# Patient Record
Sex: Female | Born: 1990 | Race: White | Hispanic: No | Marital: Married | Smoking: Never smoker
Health system: Southern US, Community
[De-identification: ages and names within clinical notes are randomized; demographics above are authoritative.]

## PROBLEM LIST (undated history)

## (undated) DIAGNOSIS — F32A Depression, unspecified: Secondary | ICD-10-CM

## (undated) DIAGNOSIS — Z8619 Personal history of other infectious and parasitic diseases: Secondary | ICD-10-CM

## (undated) DIAGNOSIS — F419 Anxiety disorder, unspecified: Secondary | ICD-10-CM

## (undated) DIAGNOSIS — K219 Gastro-esophageal reflux disease without esophagitis: Secondary | ICD-10-CM

## (undated) DIAGNOSIS — J45909 Unspecified asthma, uncomplicated: Secondary | ICD-10-CM

## (undated) DIAGNOSIS — R51 Headache: Secondary | ICD-10-CM

## (undated) DIAGNOSIS — F329 Major depressive disorder, single episode, unspecified: Secondary | ICD-10-CM

## (undated) DIAGNOSIS — T7840XA Allergy, unspecified, initial encounter: Secondary | ICD-10-CM

## (undated) DIAGNOSIS — R519 Headache, unspecified: Secondary | ICD-10-CM

## (undated) HISTORY — PX: WISDOM TOOTH EXTRACTION: SHX21

## (undated) HISTORY — DX: Unspecified asthma, uncomplicated: J45.909

## (undated) HISTORY — DX: Allergy, unspecified, initial encounter: T78.40XA

## (undated) HISTORY — DX: Gastro-esophageal reflux disease without esophagitis: K21.9

## (undated) HISTORY — PX: WRIST SURGERY: SHX841

## (undated) HISTORY — DX: Major depressive disorder, single episode, unspecified: F32.9

## (undated) HISTORY — DX: Headache, unspecified: R51.9

## (undated) HISTORY — DX: Personal history of other infectious and parasitic diseases: Z86.19

## (undated) HISTORY — DX: Depression, unspecified: F32.A

## (undated) HISTORY — DX: Headache: R51

---
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
## 2013-11-18 LAB — HM PAP SMEAR: HM Pap smear: NORMAL

## 2014-04-11 ENCOUNTER — Ambulatory Visit: Payer: Self-pay | Admitting: Physician Assistant

## 2014-07-18 ENCOUNTER — Telehealth: Payer: Self-pay

## 2014-07-18 NOTE — Telephone Encounter (Signed)
New patient  Left message for call back Non-identifiable

## 2014-07-19 ENCOUNTER — Ambulatory Visit (INDEPENDENT_AMBULATORY_CARE_PROVIDER_SITE_OTHER): Payer: Commercial Managed Care - PPO | Admitting: Family Medicine

## 2014-07-19 ENCOUNTER — Encounter: Payer: Self-pay | Admitting: Family Medicine

## 2014-07-19 VITALS — BP 110/84 | HR 93 | Temp 98.0°F | Resp 16 | Ht 65.75 in | Wt 181.2 lb

## 2014-07-19 DIAGNOSIS — F988 Other specified behavioral and emotional disorders with onset usually occurring in childhood and adolescence: Secondary | ICD-10-CM

## 2014-07-19 DIAGNOSIS — Z7289 Other problems related to lifestyle: Secondary | ICD-10-CM | POA: Insufficient documentation

## 2014-07-19 DIAGNOSIS — F419 Anxiety disorder, unspecified: Principal | ICD-10-CM | POA: Insufficient documentation

## 2014-07-19 DIAGNOSIS — F32A Depression, unspecified: Secondary | ICD-10-CM | POA: Insufficient documentation

## 2014-07-19 DIAGNOSIS — F329 Major depressive disorder, single episode, unspecified: Secondary | ICD-10-CM

## 2014-07-19 DIAGNOSIS — F341 Dysthymic disorder: Secondary | ICD-10-CM

## 2014-07-19 DIAGNOSIS — F489 Nonpsychotic mental disorder, unspecified: Secondary | ICD-10-CM

## 2014-07-19 HISTORY — DX: Other problems related to lifestyle: Z72.89

## 2014-07-19 MED ORDER — TRAZODONE HCL 50 MG PO TABS: 25.0000 mg | ORAL_TABLET | Freq: Every evening | ORAL | Status: DC | PRN

## 2014-07-19 MED ORDER — FLUOXETINE HCL 20 MG PO TABS: 20.0000 mg | ORAL_TABLET | Freq: Every day | ORAL | Status: DC

## 2014-07-19 NOTE — Progress Notes (Signed)
Pre visit review using our clinic review tool, if applicable. No additional management support is needed unless otherwise documented below in the visit note. 
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
## 2014-07-19 NOTE — Patient Instructions (Signed)
Follow up in 3-4 weeks to recheck mood Start the Prozac daily for anxiety/depression Use the Trazodone nightly as needed for sleep Start counseling as planned Call with any questions or concerns Welcome!  We're glad to have you! Hang in there!

## 2014-07-21 NOTE — Assessment & Plan Note (Signed)
New to provider, ongoing for pt.  Previously on Strattera- did not do well on this.  Discussed need to tx anxiety/depression prior to treating ADD.  Will follow.

## 2014-07-21 NOTE — Assessment & Plan Note (Signed)
New to provider, ongoing for pt.  She has committed to not cutting any more.  Has appt upcoming w/ therapist.  Pt able to contract for safety.  Will follow closely.

## 2014-07-21 NOTE — Assessment & Plan Note (Signed)
New to provider, ongoing for pt.  sxs are moderate to severe.  Has never been on medication.  Has appt upcoming w/ therapist.  Will start low dose SSRI and monitor for improvement.  Discussed that she may require psychiatrist if unable to control sxs.  Pt expressed understanding and is in agreement w/ plan.

## 2014-07-23 NOTE — Telephone Encounter (Signed)
Unable to reach patient pre visit.  

## 2014-08-15 ENCOUNTER — Ambulatory Visit (INDEPENDENT_AMBULATORY_CARE_PROVIDER_SITE_OTHER): Payer: Commercial Managed Care - PPO | Admitting: Family Medicine

## 2014-08-15 ENCOUNTER — Encounter: Payer: Self-pay | Admitting: Family Medicine

## 2014-08-15 VITALS — BP 110/76 | HR 77 | Temp 98.3°F | Resp 16 | Wt 183.5 lb

## 2014-08-15 DIAGNOSIS — F341 Dysthymic disorder: Secondary | ICD-10-CM

## 2014-08-15 DIAGNOSIS — F329 Major depressive disorder, single episode, unspecified: Secondary | ICD-10-CM

## 2014-08-15 DIAGNOSIS — F32A Depression, unspecified: Secondary | ICD-10-CM

## 2014-08-15 DIAGNOSIS — F419 Anxiety disorder, unspecified: Principal | ICD-10-CM

## 2014-08-15 NOTE — Progress Notes (Signed)
Pre visit review using our clinic review tool, if applicable. No additional management support is needed unless otherwise documented below in the visit note. 

## 2014-08-15 NOTE — Progress Notes (Signed)
   Subjective:    Patient ID: [name], female    DOB: 05-21-1991, 23 y.o.   MRN: [id number]  HPI Depression- pt reports feeling 'a lot' better.  'every one has noticed a big difference'.  Pt reports sleeping better.  Pt reports being less anxious.  Still having some problems w/ attention.  Not currently cutting.  Has appt next week to start therapy w/ Drusilla Kanner.  Rare palpitations.  No SOB.   Review of Systems For ROS see HPI     Objective:   Physical Exam  Vitals reviewed. Constitutional: She is oriented to person, place, and time. She appears well-developed and well-nourished. No distress.  HENT:  Head: Normocephalic and atraumatic.  Cardiovascular: Normal rate, regular rhythm and normal heart sounds.   Pulmonary/Chest: Effort normal and breath sounds normal. No respiratory distress. She has no wheezes. She has no rales.  Neurological: She is alert and oriented to person, place, and time.  Skin: Skin is warm and dry.  Psychiatric: She has a normal mood and affect. Her behavior is normal. Thought content normal.          Assessment & Plan:

## 2014-08-15 NOTE — Assessment & Plan Note (Signed)
Much improved since starting Prozac.  Pt is not interested in increasing dose at this time as she is doing so much better.  Pt continues to have some issues w/ her focus but will hold on stimulants until pt's anxiety is under good control and she has started her therapy sessions.  Will follow closely.

## 2014-08-15 NOTE — Patient Instructions (Signed)
Follow up in 6-8 weeks to recheck anxiety and attention Continue the meds daily as directed and use the sleep meds as needed Keep up the good work!  You're doing great! Call with any questions or concerns I'm SO proud of you!

## 2014-10-02 ENCOUNTER — Ambulatory Visit: Payer: Commercial Managed Care - PPO | Admitting: Family Medicine

## 2014-11-18 ENCOUNTER — Encounter: Payer: Self-pay | Admitting: Family Medicine

## 2014-11-18 ENCOUNTER — Encounter: Payer: Self-pay | Admitting: General Practice

## 2014-11-18 ENCOUNTER — Ambulatory Visit (INDEPENDENT_AMBULATORY_CARE_PROVIDER_SITE_OTHER): Payer: Commercial Managed Care - PPO | Admitting: Family Medicine

## 2014-11-18 ENCOUNTER — Ambulatory Visit (INDEPENDENT_AMBULATORY_CARE_PROVIDER_SITE_OTHER): Payer: Commercial Managed Care - PPO | Admitting: General Practice

## 2014-11-18 VITALS — BP 112/72 | HR 110 | Temp 98.1°F | Resp 16 | Wt 191.4 lb

## 2014-11-18 DIAGNOSIS — F988 Other specified behavioral and emotional disorders with onset usually occurring in childhood and adolescence: Secondary | ICD-10-CM

## 2014-11-18 DIAGNOSIS — F418 Other specified anxiety disorders: Secondary | ICD-10-CM

## 2014-11-18 DIAGNOSIS — Z23 Encounter for immunization: Secondary | ICD-10-CM

## 2014-11-18 DIAGNOSIS — F419 Anxiety disorder, unspecified: Principal | ICD-10-CM

## 2014-11-18 DIAGNOSIS — F909 Attention-deficit hyperactivity disorder, unspecified type: Secondary | ICD-10-CM

## 2014-11-18 DIAGNOSIS — F329 Major depressive disorder, single episode, unspecified: Secondary | ICD-10-CM

## 2014-11-18 MED ORDER — AMPHETAMINE-DEXTROAMPHET ER 20 MG PO CP24: 20.0000 mg | ORAL_CAPSULE | Freq: Every day | ORAL | Status: DC

## 2014-11-18 MED ORDER — FLUOXETINE HCL 20 MG PO TABS: 20.0000 mg | ORAL_TABLET | Freq: Every day | ORAL | Status: DC

## 2014-11-18 NOTE — Progress Notes (Signed)
   Subjective:    Patient ID: Claire Rivas, female    DOB: 09/21/1991, 23 y.o.   MRN: [id number]  HPI Depression- chronic problem, pt reports doing 'really good' on Prozac.  Ran out yesterday.  'my mood has been really great'.  Pt reports sleeping better.  No thoughts of self harm or recent cutting.  ADD- chronic problem, pt is not currently medicated and reports having a very difficult time concentrating or completing tasks.  Pt's difficulties are occuring at both home and work.  Pt was on Strattera previously and it was 'too high a dose' and it made pt very sick.  Pt is not interested in restarting Strattera.  Pt has been on Adderall previously and 'this worked really well' but 'the dose was too high'.   Review of Systems For ROS see HPI     Objective:   Physical Exam  Constitutional: She is oriented to person, place, and time. She appears well-developed and well-nourished. No distress.  HENT:  Head: Normocephalic and atraumatic.  Cardiovascular: Normal rate, regular rhythm, normal heart sounds and intact distal pulses.   Pulmonary/Chest: Effort normal and breath sounds normal. No respiratory distress. She has no wheezes. She has no rales.  Neurological: She is alert and oriented to person, place, and time.  Skin: Skin is warm and dry.  Psychiatric: She has a normal mood and affect. Her behavior is normal. Thought content normal.  Vitals reviewed.         Assessment & Plan:

## 2014-11-18 NOTE — Progress Notes (Signed)
Pre visit review using our clinic review tool, if applicable. No additional management support is needed unless otherwise documented below in the visit note. 

## 2014-11-18 NOTE — Patient Instructions (Signed)
Follow up in 6 weeks to recheck attention Start the Adderall in the AM Continue the Fluoxetine daily for depression/anxiety Please monitor your anxiety level now that we're starting a stimulant medication Call with any questions or concerns Happy Holidays!

## 2014-11-19 NOTE — Assessment & Plan Note (Signed)
Pt continues to struggle w/ attention and concentration, despite anxiety and depression improving.  Pt not willing to start Strattera due to previous bad reaction.  Has been on Adderall previously w/o difficulty but felt the dose was too high.  Will start at adult starting dose and monitor for symptom improvement.  Controlled substance agreement signed.

## 2014-11-19 NOTE — Assessment & Plan Note (Signed)
Improved since starting Prozac.  Will continue to follow and adjust dose prn.

## 2014-12-23 ENCOUNTER — Telehealth: Payer: Self-pay | Admitting: Family Medicine

## 2014-12-23 MED ORDER — AMPHETAMINE-DEXTROAMPHET ER 20 MG PO CP24: 20.0000 mg | ORAL_CAPSULE | Freq: Every day | ORAL | Status: DC

## 2014-12-23 NOTE — Telephone Encounter (Signed)
Caller name: Theodosia Quayoole, Tamico L Relation to pt: self  Call back number: [phone]   Reason for call:  Pt requesting a refill amphetamine-dextroamphetamine (ADDERALL XR) 20 MG 24 hr capsule

## 2014-12-23 NOTE — Telephone Encounter (Signed)
Med filled will notify pt.  

## 2014-12-23 NOTE — Telephone Encounter (Signed)
Ok for #30 

## 2014-12-23 NOTE — Telephone Encounter (Signed)
Last OV 11-18-14  adderall filled 11-18-14 #30 with 0

## 2014-12-30 ENCOUNTER — Ambulatory Visit: Payer: Commercial Managed Care - PPO | Admitting: Family Medicine

## 2015-01-02 ENCOUNTER — Ambulatory Visit (INDEPENDENT_AMBULATORY_CARE_PROVIDER_SITE_OTHER): Payer: Commercial Managed Care - PPO | Admitting: Family Medicine

## 2015-01-02 ENCOUNTER — Encounter: Payer: Self-pay | Admitting: Family Medicine

## 2015-01-02 VITALS — BP 112/70 | HR 78 | Temp 98.1°F | Resp 16 | Wt 198.0 lb

## 2015-01-02 DIAGNOSIS — F329 Major depressive disorder, single episode, unspecified: Secondary | ICD-10-CM

## 2015-01-02 DIAGNOSIS — F988 Other specified behavioral and emotional disorders with onset usually occurring in childhood and adolescence: Secondary | ICD-10-CM

## 2015-01-02 DIAGNOSIS — F419 Anxiety disorder, unspecified: Secondary | ICD-10-CM

## 2015-01-02 DIAGNOSIS — F418 Other specified anxiety disorders: Secondary | ICD-10-CM

## 2015-01-02 DIAGNOSIS — F909 Attention-deficit hyperactivity disorder, unspecified type: Secondary | ICD-10-CM

## 2015-01-02 DIAGNOSIS — F32A Depression, unspecified: Secondary | ICD-10-CM

## 2015-01-02 NOTE — Assessment & Plan Note (Signed)
Chronic problem.  Pt still feels her Prozac dose is appropriate and that she is able to handle her grief over the loss of her cat.  No med changes at this time.  Will follow.

## 2015-01-02 NOTE — Assessment & Plan Note (Signed)
Chronic problem.  sxs are much better since starting meds last visit.  No noted side effects.  Not hypertensive or tachycardic due to stimulant.  No med changes at this time.  Will follow.

## 2015-01-02 NOTE — Progress Notes (Signed)
   Subjective:    Patient ID: Claire Rivas, female    DOB: 06/02/1991, 24 y.o.   MRN: [id number]  HPI Depression- chronic problem.  Pt recently lost her kitten due to seizures.  This has been very hard for pt.  She started a [employer]Fund[employer] Account to support the other surviving cat.  Pt finds this very cathartic b/c 'people have been supportive'.  Pt is crying herself to sleep regularly.  Pt is not interested in adjusting meds, 'i just have to get through it'.  ADHD- started Adderall at last visit.  BP is WNL, HR WNL.  Pt feels focus has improved 'a lot'.  No palpitations.   Review of Systems For ROS see HPI     Objective:   Physical Exam  Constitutional: She is oriented to person, place, and time. She appears well-developed and well-nourished. No distress.  HENT:  Head: Normocephalic and atraumatic.  Eyes: Conjunctivae and EOM are normal. Pupils are equal, round, and reactive to light.  Neck: Normal range of motion. Neck supple. No thyromegaly present.  Cardiovascular: Normal rate, regular rhythm, normal heart sounds and intact distal pulses.   No murmur heard. Pulmonary/Chest: Effort normal and breath sounds normal. No respiratory distress.  Musculoskeletal: She exhibits no edema.  Lymphadenopathy:    She has no cervical adenopathy.  Neurological: She is alert and oriented to person, place, and time.  Skin: Skin is warm and dry.  Psychiatric: She has a normal mood and affect. Her behavior is normal.  Vitals reviewed.         Assessment & Plan:

## 2015-01-02 NOTE — Progress Notes (Signed)
Pre visit review using our clinic review tool, if applicable. No additional management support is needed unless otherwise documented below in the visit note. 

## 2015-01-02 NOTE — Patient Instructions (Signed)
Schedule your complete physical in 6 months No med changes at this time Call with any questions or concerns Keep up the good work!  You're doing great! Stay safe and warm this weekend!!!

## 2015-01-23 ENCOUNTER — Other Ambulatory Visit: Payer: Self-pay | Admitting: General Practice

## 2015-01-23 ENCOUNTER — Telehealth: Payer: Self-pay | Admitting: Family Medicine

## 2015-01-23 MED ORDER — AMPHETAMINE-DEXTROAMPHET ER 20 MG PO CP24: 20.0000 mg | ORAL_CAPSULE | Freq: Every day | ORAL | Status: DC

## 2015-01-23 MED ORDER — FLUOXETINE HCL 20 MG PO TABS: 20.0000 mg | ORAL_TABLET | Freq: Every day | ORAL | Status: DC

## 2015-01-23 NOTE — Telephone Encounter (Signed)
Caller name:Preeti Dutch QuintPoole Relationship to patient:self Can be reached:cbr cell   Reason for call: [facility] and needs to have her adderall and prozac refilled before moving- was last seen 01/02/15.

## 2015-01-23 NOTE — Telephone Encounter (Signed)
meds filled and pt notified.  

## 2015-06-16 ENCOUNTER — Emergency Department (HOSPITAL_COMMUNITY)
Admission: EM | Admit: 2015-06-16 | Discharge: 2015-06-17 | Disposition: A | Discharge status: Other Acute Inpatient Hospital | Payer: Commercial Managed Care - PPO | Attending: Emergency Medicine | Admitting: Emergency Medicine

## 2015-06-16 ENCOUNTER — Encounter (HOSPITAL_COMMUNITY): Payer: Self-pay | Admitting: Nurse Practitioner

## 2015-06-16 DIAGNOSIS — Z8619 Personal history of other infectious and parasitic diseases: Secondary | ICD-10-CM | POA: Insufficient documentation

## 2015-06-16 DIAGNOSIS — Z3202 Encounter for pregnancy test, result negative: Secondary | ICD-10-CM | POA: Insufficient documentation

## 2015-06-16 DIAGNOSIS — R45851 Suicidal ideations: Secondary | ICD-10-CM | POA: Diagnosis present

## 2015-06-16 DIAGNOSIS — F1510 Other stimulant abuse, uncomplicated: Secondary | ICD-10-CM | POA: Diagnosis not present

## 2015-06-16 DIAGNOSIS — J45909 Unspecified asthma, uncomplicated: Secondary | ICD-10-CM | POA: Diagnosis not present

## 2015-06-16 DIAGNOSIS — Z79899 Other long term (current) drug therapy: Secondary | ICD-10-CM | POA: Insufficient documentation

## 2015-06-16 DIAGNOSIS — F322 Major depressive disorder, single episode, severe without psychotic features: Secondary | ICD-10-CM | POA: Diagnosis present

## 2015-06-16 DIAGNOSIS — K219 Gastro-esophageal reflux disease without esophagitis: Secondary | ICD-10-CM | POA: Insufficient documentation

## 2015-06-16 DIAGNOSIS — F1210 Cannabis abuse, uncomplicated: Secondary | ICD-10-CM | POA: Insufficient documentation

## 2015-06-16 LAB — CBC
HCT: 38.8 % (ref 36.0–46.0)
HEMOGLOBIN: 13 g/dL (ref 12.0–15.0)
MCH: 28.6 pg (ref 26.0–34.0)
MCHC: 33.5 g/dL (ref 30.0–36.0)
MCV: 85.5 fL (ref 78.0–100.0)
PLATELETS: 262 10*3/uL (ref 150–400)
RBC: 4.54 MIL/uL (ref 3.87–5.11)
RDW: 12.4 % (ref 11.5–15.5)
WBC: 7.3 10*3/uL (ref 4.0–10.5)

## 2015-06-16 LAB — POC URINE PREG, ED: PREG TEST UR: NEGATIVE

## 2015-06-16 MED ORDER — ACETAMINOPHEN 325 MG PO TABS: 650.0000 mg | ORAL_TABLET | ORAL | Status: DC | PRN

## 2015-06-16 MED ORDER — LORAZEPAM 1 MG PO TABS: 1.0000 mg | ORAL_TABLET | Freq: Three times a day (TID) | ORAL | Status: DC | PRN

## 2015-06-16 MED ORDER — ONDANSETRON HCL 4 MG PO TABS
4.0000 mg | ORAL_TABLET | Freq: Three times a day (TID) | ORAL | Status: DC | PRN
Start: 2015-06-16 — End: 2015-06-17

## 2015-06-16 NOTE — ED Provider Notes (Signed)
CSN: [id number]     Arrival date & time 06/16/15  2301 History   This chart was scribed for non-physician practitioner, Elpidio Anis, PA-C, working with Layla Maw Ward, DO, by Budd Palmer ED Scribe. This patient was seen in room [facility]4/[facility] and the patient's care was started at 11:24 PM    Chief Complaint  Patient presents with  . Suicidal  . Depression  The history is provided by the patient. No language interpreter was used.   HPI Comments: Claire Rivas is a 24 y.o. female with a PMHx of depression who presents to the Emergency Department complaining of SI and for the last several days. She has been with friends who have worked with her to make her feel better but she feels her symptoms are getting worse. No self harm, no HI. She has a Hx of cutting, but not of suicidal attempt.  Past Medical History  Diagnosis Date  . Asthma   . History of chicken pox   . Depression   . Frequent headaches   . GERD (gastroesophageal reflux disease)   . Allergy    Past Surgical History  Procedure Laterality Date  . Wisdom tooth extraction     Family History  Problem Relation Age of Onset  . Mental illness Mother   . Depression Mother   . Diabetes Father     type 2  . Arthritis Maternal Grandmother   . Hyperlipidemia Maternal Grandmother   . Heart disease Maternal Grandmother   . Alcohol abuse Maternal Grandfather   . Hypertension Maternal Grandfather   . Diabetes Paternal Grandmother   . Cancer Other     lung  . Heart disease Other    History  Substance Use Topics  . Smoking status: Never Smoker   . Smokeless tobacco: Not on file  . Alcohol Use: Yes   OB History    No data available     Review of Systems  Constitutional: Negative for fever and chills.  HENT: Negative.   Respiratory: Negative.   Cardiovascular: Negative.   Gastrointestinal: Negative.   Musculoskeletal: Negative.   Skin: Negative.   Neurological: Negative.   Psychiatric/Behavioral: Positive for suicidal  ideas and dysphoric mood.  All other systems reviewed and are negative.   Allergies  Sulfa antibiotics  Home Medications   Prior to Admission medications   Medication Sig Start Date End Date Taking? Authorizing Provider  amphetamine-dextroamphetamine (ADDERALL XR) 20 MG 24 hr capsule Take 1 capsule (20 mg total) by mouth daily. 01/23/15   Sheliah Hatch, MD  FLUoxetine (PROZAC) 20 MG tablet Take 1 tablet (20 mg total) by mouth daily. 01/23/15   Sheliah Hatch, MD  traZODone (DESYREL) 50 MG tablet Take 0.5-1 tablets (25-50 mg total) by mouth at bedtime as needed for sleep. Patient not taking: Reported on 01/02/2015 07/19/14   Sheliah Hatch, MD   BP 126/83 mmHg  Pulse 100  Temp(Src) 98.2 F (36.8 C) (Oral)  Resp 14  Ht  (1.651 m)  Wt 185 lb (83.915 kg)  BMI 30.79 kg/m2  SpO2 98% Physical Exam  Constitutional: She is oriented to person, place, and time. She appears well-developed and well-nourished. No distress.  HENT:  Head: Normocephalic and atraumatic.  Mouth/Throat: Oropharynx is clear and moist.  Eyes: Conjunctivae and EOM are normal. Pupils are equal, round, and reactive to light.  Neck: Normal range of motion. Neck supple. No tracheal deviation present.  Cardiovascular: Normal rate.   Pulmonary/Chest: Breath sounds normal. No  respiratory distress.  Abdominal: Soft.  Musculoskeletal: Normal range of motion.  Neurological: She is alert and oriented to person, place, and time.  Skin: Skin is warm and dry.  Nursing note and vitals reviewed.   ED Course  Procedures  DIAGNOSTIC STUDIES: Oxygen Saturation is 98% on RA, normal by my interpretation.    COORDINATION OF CARE: 11:27 PM - Discussed plans to medically clear her and give her a health assessment. Pt advised of plan for treatment and pt agrees.  Labs Review Labs Reviewed  CBC  COMPREHENSIVE METABOLIC PANEL  ETHANOL  ACETAMINOPHEN LEVEL  SALICYLATE LEVEL  URINE RAPID DRUG SCREEN, HOSP PERFORMED   POC URINE PREG, ED   Results for orders placed or performed during the hospital encounter of 06/16/15  CBC  Result Value Ref Range   WBC 7.3 4.0 - 10.5 K/uL   RBC 4.54 3.87 - 5.11 MIL/uL   Hemoglobin 13.0 12.0 - 15.0 g/dL   HCT 16.138.8 09.636.0 - 04.546.0 %   MCV 85.5 78.0 - 100.0 fL   MCH 28.6 26.0 - 34.0 pg   MCHC 33.5 30.0 - 36.0 g/dL   RDW 40.912.4 81.111.5 - 91.415.5 %   Platelets 262 150 - 400 K/uL  Comprehensive metabolic panel  Result Value Ref Range   Sodium 137 135 - 145 mmol/L   Potassium 3.4 (L) 3.5 - 5.1 mmol/L   Chloride 107 101 - 111 mmol/L   CO2 23 22 - 32 mmol/L   Glucose, Bld 109 (H) 65 - 99 mg/dL   BUN 14 6 - 20 mg/dL   Creatinine, Ser 7.820.64 0.44 - 1.00 mg/dL   Calcium 8.9 8.9 - 95.610.3 mg/dL   Total Protein 7.1 6.5 - 8.1 g/dL   Albumin 4.3 3.5 - 5.0 g/dL   AST 23 15 - 41 U/L   ALT 28 14 - 54 U/L   Alkaline Phosphatase 98 38 - 126 U/L   Total Bilirubin 0.8 0.3 - 1.2 mg/dL   GFR calc non Af Amer >60 >60 mL/min   GFR calc Af Amer >60 >60 mL/min   Anion gap 7 5 - 15  Ethanol (ETOH)  Result Value Ref Range   Alcohol, Ethyl (B) <5 <5 mg/dL  Acetaminophen level  Result Value Ref Range   Acetaminophen (Tylenol), Serum <10 (L) 10 - 30 ug/mL  Salicylate level  Result Value Ref Range   Salicylate Lvl <4.0 2.8 - 30.0 mg/dL  Urine rapid drug screen (hosp performed)not at [facility]  Result Value Ref Range   Opiates NONE DETECTED NONE DETECTED   Cocaine NONE DETECTED NONE DETECTED   Benzodiazepines NONE DETECTED NONE DETECTED   Amphetamines POSITIVE (A) NONE DETECTED   Tetrahydrocannabinol POSITIVE (A) NONE DETECTED   Barbiturates NONE DETECTED NONE DETECTED  POC Urine Pregnancy, ED ([facility] NOT [facility])  Result Value Ref Range   Preg Test, Ur NEGATIVE NEGATIVE    Imaging Review No results found.   EKG Interpretation None      MDM   Final diagnoses:  None    1. Suicidal ideation  Will place in psych hold waiting for TTS consultation to determine disposition.   I  personally performed the services described in this documentation, which was scribed in my presence. The recorded information has been reviewed and is accurate.     Elpidio AnisShari Yousof Alderman, PA-C 06/17/15 0325  Layla MawKristen N Ward, DO 06/17/15 [phone]

## 2015-06-16 NOTE — ED Notes (Signed)
Pt states she feels suicidal, states she plans to use razors four days ago, also states she has been severely depressed. All these she states is secondary to family alienation which she says is the reason she has come in to be seen today.

## 2015-06-16 NOTE — ED Notes (Signed)
Called staffing requesting a sitter.

## 2015-06-16 NOTE — ED Notes (Addendum)
Pt has been seen and wanded by security.   Pt has been changed into scrubs. Pt has white belonging bag and flower covered duffle bag.

## 2015-06-17 ENCOUNTER — Inpatient Hospital Stay (HOSPITAL_COMMUNITY)
Admission: AD | Admit: 2015-06-17 | Discharge: 2015-06-20 | DRG: 885 | Disposition: A | Discharge status: Home / Self Care | Payer: 59 | Source: Intra-hospital | Attending: Psychiatry | Admitting: Psychiatry

## 2015-06-17 ENCOUNTER — Encounter (HOSPITAL_COMMUNITY): Payer: Self-pay | Admitting: *Deleted

## 2015-06-17 DIAGNOSIS — Z833 Family history of diabetes mellitus: Secondary | ICD-10-CM | POA: Diagnosis not present

## 2015-06-17 DIAGNOSIS — F322 Major depressive disorder, single episode, severe without psychotic features: Secondary | ICD-10-CM | POA: Diagnosis present

## 2015-06-17 DIAGNOSIS — J45909 Unspecified asthma, uncomplicated: Secondary | ICD-10-CM | POA: Diagnosis present

## 2015-06-17 DIAGNOSIS — Z8249 Family history of ischemic heart disease and other diseases of the circulatory system: Secondary | ICD-10-CM | POA: Diagnosis not present

## 2015-06-17 DIAGNOSIS — Z818 Family history of other mental and behavioral disorders: Secondary | ICD-10-CM

## 2015-06-17 DIAGNOSIS — R45851 Suicidal ideations: Secondary | ICD-10-CM | POA: Diagnosis present

## 2015-06-17 DIAGNOSIS — K219 Gastro-esophageal reflux disease without esophagitis: Secondary | ICD-10-CM | POA: Diagnosis present

## 2015-06-17 DIAGNOSIS — F329 Major depressive disorder, single episode, unspecified: Secondary | ICD-10-CM | POA: Diagnosis not present

## 2015-06-17 DIAGNOSIS — Z882 Allergy status to sulfonamides status: Secondary | ICD-10-CM

## 2015-06-17 DIAGNOSIS — F419 Anxiety disorder, unspecified: Secondary | ICD-10-CM | POA: Diagnosis not present

## 2015-06-17 HISTORY — DX: Anxiety disorder, unspecified: F41.9

## 2015-06-17 LAB — COMPREHENSIVE METABOLIC PANEL
ALBUMIN: 4.3 g/dL (ref 3.5–5.0)
ALT: 28 U/L (ref 14–54)
AST: 23 U/L (ref 15–41)
Alkaline Phosphatase: 98 U/L (ref 38–126)
Anion gap: 7 (ref 5–15)
BILIRUBIN TOTAL: 0.8 mg/dL (ref 0.3–1.2)
BUN: 14 mg/dL (ref 6–20)
CO2: 23 mmol/L (ref 22–32)
Calcium: 8.9 mg/dL (ref 8.9–10.3)
Chloride: 107 mmol/L (ref 101–111)
Creatinine, Ser: 0.64 mg/dL (ref 0.44–1.00)
Glucose, Bld: 109 mg/dL — ABNORMAL HIGH (ref 65–99)
POTASSIUM: 3.4 mmol/L — AB (ref 3.5–5.1)
Sodium: 137 mmol/L (ref 135–145)
TOTAL PROTEIN: 7.1 g/dL (ref 6.5–8.1)

## 2015-06-17 LAB — RAPID URINE DRUG SCREEN, HOSP PERFORMED
Amphetamines: POSITIVE — AB
BENZODIAZEPINES: NOT DETECTED
Barbiturates: NOT DETECTED
Cocaine: NOT DETECTED
OPIATES: NOT DETECTED
TETRAHYDROCANNABINOL: POSITIVE — AB

## 2015-06-17 LAB — SALICYLATE LEVEL: Salicylate Lvl: <4 mg/dL (ref 2.8–30.0)

## 2015-06-17 LAB — ETHANOL: Alcohol, Ethyl (B): <5 mg/dL (ref <5)

## 2015-06-17 LAB — ACETAMINOPHEN LEVEL: Acetaminophen (Tylenol), Serum: <10 ug/mL — ABNORMAL LOW (ref 10–30)

## 2015-06-17 MED ORDER — ALBUTEROL SULFATE HFA 108 (90 BASE) MCG/ACT IN AERS: 1.0000 | INHALATION_SPRAY | Freq: Four times a day (QID) | RESPIRATORY_TRACT | Status: DC | PRN

## 2015-06-17 MED ORDER — MAGNESIUM HYDROXIDE 400 MG/5ML PO SUSP: 30.0000 mL | Freq: Every day | ORAL | Status: DC | PRN

## 2015-06-17 MED ORDER — ACETAMINOPHEN 325 MG PO TABS
650.0000 mg | ORAL_TABLET | ORAL | Status: DC | PRN
  Administered 2015-06-17: 650 mg via ORAL
  Filled 2015-06-17: qty 2

## 2015-06-17 MED ORDER — ACETAMINOPHEN 325 MG PO TABS: 650.0000 mg | ORAL_TABLET | Freq: Four times a day (QID) | ORAL | Status: DC | PRN

## 2015-06-17 MED ORDER — FLUOXETINE HCL 20 MG PO CAPS
20.0000 mg | ORAL_CAPSULE | Freq: Every day | ORAL | Status: DC
  Administered 2015-06-17: 20 mg via ORAL
  Filled 2015-06-17: qty 1

## 2015-06-17 MED ORDER — ONDANSETRON HCL 4 MG PO TABS: 4.0000 mg | ORAL_TABLET | Freq: Three times a day (TID) | ORAL | Status: DC | PRN

## 2015-06-17 MED ORDER — LORAZEPAM 1 MG PO TABS: 1.0000 mg | ORAL_TABLET | Freq: Three times a day (TID) | ORAL | Status: DC | PRN

## 2015-06-17 MED ORDER — FLUOXETINE HCL 20 MG PO CAPS
20.0000 mg | ORAL_CAPSULE | Freq: Every day | ORAL | Status: DC
  Administered 2015-06-18 – 2015-06-20 (×3): 20 mg via ORAL
  Filled 2015-06-17: qty 14
  Filled 2015-06-17 (×4): qty 1

## 2015-06-17 MED ORDER — ALUM & MAG HYDROXIDE-SIMETH 200-200-20 MG/5ML PO SUSP: 30.0000 mL | ORAL | Status: DC | PRN

## 2015-06-17 NOTE — ED Notes (Signed)
Paperwork, and belongings bags x 2 given to [facility] transporter.

## 2015-06-17 NOTE — Progress Notes (Signed)
Adult Psychoeducational Group Note  Date:  06/17/2015 Time:  10:43 PM  Group Topic/Focus:  Wrap-Up Group:   The focus of this group is to help patients review their daily goal of treatment and discuss progress on daily workbooks.  Participation Level:  Active  Participation Quality:  Appropriate  Affect:  Appropriate  Cognitive:  Appropriate  Insight: Good  Engagement in Group:  Engaged  Modes of Intervention:  Support  Additional Comments:  Patient stated he had just arrived today and that he was trying to get adjusted, and rated his day a 7.  Natasha MeadKiara M Giovana Faciane 06/17/2015, 10:43 PM

## 2015-06-17 NOTE — ED Notes (Signed)
Patient ambulated to the bathroom accompanied by sitter.

## 2015-06-17 NOTE — ED Notes (Signed)
Psychiatry at bedside.

## 2015-06-17 NOTE — Tx Team (Signed)
Initial Interdisciplinary Treatment Plan   PATIENT STRESSORS: Educational concerns Financial difficulties Marital or family conflict Medication change or noncompliance Occupational concerns Substance abuse   PATIENT STRENGTHS: Ability for insight Average or above average intelligence Capable of independent living Communication skills General fund of knowledge Motivation for treatment/growth Physical Health Supportive family/friends   PROBLEM LIST: Problem List/Patient Goals Date to be addressed Date deferred Reason deferred Estimated date of resolution  "suicidal thoughts" 06/17/2015   D/c        "anxiety" 06/17/2015   D/c        "depression" 06/17/2015   D/c        "panic attacks" 06/17/2015   D/c               DISCHARGE CRITERIA:  Ability to meet basic life and health needs Adequate post-discharge living arrangements Improved stabilization in mood, thinking, and/or behavior Medical problems require only outpatient monitoring Motivation to continue treatment in a less acute level of care Need for constant or close observation no longer present Reduction of life-threatening or endangering symptoms to within safe limits Safe-care adequate arrangements made Verbal commitment to aftercare and medication compliance Withdrawal symptoms are absent or subacute and managed without 24-hour nursing intervention  PRELIMINARY DISCHARGE PLAN: Attend aftercare/continuing care group Attend PHP/IOP Attend 12-step recovery group Outpatient therapy Participate in family therapy Return to previous living arrangement Return to previous work or school arrangements  PATIENT/FAMIILY INVOLVEMENT: This treatment plan has been presented to and reviewed with the patient, Claire Rivas.  The patient and family have been given the opportunity to ask questions and make suggestions.  Earline MayotteKnight, Hezzie Karim Shephard 06/17/2015, 5:11 PM
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
## 2015-06-17 NOTE — BH Assessment (Signed)
[facility] Assessment Progress Note  Per Nanine MeansJamison Lord, NP, this pt requires psychiatric hospitalization at this time.  Berneice Heinrichina Tate, RN, [id number].  Pt has signed Voluntary Admission and Consent for Treatment, as well as Consent to Release Information, and signed [facility].  Pt's nurse has been notified, and agrees to send original paperwork along with pt via [place], and to call report to [phone].  Doylene Canninghomas Tyliah Schlereth, MA Triage Specialist [phone]

## 2015-06-17 NOTE — ED Notes (Signed)
[place] transportation [facility]
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
## 2015-06-17 NOTE — Progress Notes (Signed)
Recreation Therapy Notes  Animal-Assisted Activity (AAA) Program Checklist/Progress Notes Patient Eligibility Criteria Checklist & Daily Group note for Rec Tx Intervention  Date: 07.05.16 Time: 230 pm Location: [address] Dayroom   AAA/T Program Assumption of Risk Form signed by Patient/ or Parent Legal Guardian yes  Patient is free of allergies or sever asthma yes  Patient reports no fear of animals yes  Patient reports no history of cruelty to animalsyes  Patient understands his/her participation is voluntary yes  Patient washes hands before animal contact yes  Patient washes hands after animal contact yes  Behavioral Response:  Engaged  Education: Hand Washing, Appropriate Animal Interaction   Education Outcome: Acknowledges understanding/In group clarification offered/Needs additional education.   Clinical Observations/Feedback: Patient attended group.   Alden Bensinger, LRT/CTRS         Claire Rivas A 06/17/2015 4:49 PM 

## 2015-06-18 ENCOUNTER — Encounter (HOSPITAL_COMMUNITY): Payer: Self-pay | Admitting: Psychiatry

## 2015-06-18 DIAGNOSIS — F322 Major depressive disorder, single episode, severe without psychotic features: Principal | ICD-10-CM

## 2015-06-18 MED ORDER — NICOTINE 7 MG/24HR TD PT24
7.0000 mg | MEDICATED_PATCH | Freq: Every day | TRANSDERMAL | Status: DC
  Filled 2015-06-18 (×2): qty 1

## 2015-06-18 NOTE — [facility] Group Notes (Signed)
   [facility] Dis[facility] Planning [facility] Note  06/18/2015  8:45 AM   Participation Quality: Alert, Appropriate and Oriented  Mood/Affect: Depressed and Flat  Depression Rating: 1-2  Anxiety Rating: 0  Thoughts of Suicide: Pt denies SI/HI  Will you contract for safety? Yes  Current AVH: Pt denies  Plan for Discharge/Comments: Pt attended discharge planning group and actively participated in group. CSW provided pt with today's workbook. Patient plans to return home to follow up with outpatient services yet to be determined.   Transportation Means: Pt reports access to transportation  Supports: No supports mentioned at this time  Samuella BruinKristin Jennel Mara, MSW, [employer]CSWA Clinical Social Worker [facility] [phone]

## 2015-06-18 NOTE — [facility] Group Notes (Addendum)
[facility] LCSW Group Therapy 06/18/2015  1:15 PM Type of Therapy: Group Therapy Participation Level: Active  Participation Quality: Attentive, sharing, supportive  Affect: Appropriate  Cognitive: Alert and Oriented  Insight: Developing/Improving and Engaged  Engagement in Therapy: Developing/Improving and Engaged  Modes of Intervention: Clarification, Confrontation, Discussion, Education, Exploration, Limit-setting, Orientation, Problem-solving, Rapport Building, Dance movement psychotherapisteality Testing, Socialization and Support  Summary of Progress/Problems: The topic for group today was emotional regulation. This group focused on both positive and negative emotion identification and allowed group members to process ways to identify feelings, regulate negative emotions, and find healthy ways to manage internal/external emotions. Group members were asked to reflect on a time when their reaction to an emotion led to a negative outcome and explored how alternative responses using emotion regulation would have benefited them. Group members were also asked to discuss a time when emotion regulation was utilized when a negative emotion was experienced. Patient engaged in discussion regarding emotions, stressors, and coping skills. Patient discussed having to set boundaries with her father due to him being unsupportive and negative towards patient. She shared that listening to music, journaling, and spending time in nature were positive coping skills for her. CSW and other group members provided patient with emotional support and encouragement. Patient participated in a progressive muscle relaxation exercise at the end of group.  Samuella BruinKristin Kaitlin Alcindor, MSW, [employer]CSWA Clinical Social Worker [facility] [phone]
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
## 2015-06-18 NOTE — Progress Notes (Signed)
Recreation Therapy Notes  Date: 07.06.16 Time: 930 am Location: [address] Group Room  Group Topic: Stress Management  Goal Area(s) Addresses:  Patient will verbalize importance of using healthy stress management.  Patient will identify positive emotions associated with healthy stress management.   Intervention: Stress Management  Activity :  Guided Imagery.  LRT introduced and educated patients on the stress management technique of guided imagery.  A script was used to deliver the technique to patients.  Patients were asked to follow the script read a loud by LRT to engage in practicing the stress management technique.  Education:  Stress Management, Discharge Planning.   Education Outcome: Acknowledges edcuation/In group clarification offered/Needs additional education  Clinical Observations/Feedback: Did not attend group.    Caroll RancherMarjette Corinna Burkman, LRT/CTRS         Caroll RancherLindsay, Ahsan Esterline A 06/18/2015 3:49 PM

## 2015-06-18 NOTE — H&P (Signed)
Psychiatric Admission Assessment Adult  Patient Identification: Claire Rivas MRN:  [id number] Date of Evaluation:  06/18/2015 Chief Complaint:   " I have been having a lot of problems with my step father and mother " Principal Diagnosis: <principal problem not specified> Diagnosis:   Patient Active Problem List   Diagnosis Date Noted  . Depression, major, single episode, severe [F32.2] 06/17/2015  . Suicidal ideations [R45.851] 06/17/2015  . ADD (attention deficit disorder) [F90.9] 07/19/2014  . Deliberate self-cutting [F48.9] 07/19/2014   History of Present Illness:: 24 year old single female, who reports having increased tension at home, particularly with her step father and mother. States that they do not approve of her boyfriend, and  that they have told her to " choose between your family and your boyfriend". Also , has felt hurt emotionally from her step father telling her she could not use his last name and had to continue using her biological father's name. In the context of these stressors she felt more depressed and she developed  Self injurious ideations, with thoughts of cutting . She states " I have a lot to live for and I did not want to hurt myself".  States that her parents told her she had to come to the hospital " or they were going to commit me ".  Elements:   Patient reports worsening psychosocial stressors, primarily related to her interactions with her step father, mother , related to their disapproval of her boyfriend. Developed thoughts of cutting self and came to hospital  Associated Signs/Symptoms: Depression Symptoms:   At this time denies any severe neuro-vegetative symptoms of depression- and describes mostly increased depression, anxiety only when interacting with her parents. She denies anhedonia, she states she has a good sense of self esteem. (Hypo) Manic Symptoms:   Denies  Anxiety Symptoms:   States she used to have panic attacks and some agoraphobia but has  been improving overtime .  Psychotic Symptoms:  Denies  PTSD Symptoms: Does not endorse PTSD symptoms Total Time spent with patient: 45 minutes   Past Psychiatric History- denies any prior psychiatric admissions, denies any history of suicide attempts, history of self cutting, last cut about 3 years ago. Denies history of  mania, hypomania, denies history of psychosis, denies OCD, states she has been diagnosed with ADHD and has been prescribed Adderall.  She has a history of depression, for which she has been prescribed Prozac.  Denies any history of violence .  Past Medical History:  Allergic to Sulfa Medications, Shellfish. Does not smoke .  Past Medical History  Diagnosis Date  . Asthma   . History of chicken pox   . Depression   . Frequent headaches   . GERD (gastroesophageal reflux disease)   . Allergy   . Anxiety     Past Surgical History  Procedure Laterality Date  . Wisdom tooth extraction     Family History:  Parents separated, patient lives with mother and step father, has four siblings . States maternal aunt committed suicide . Mother has history of depression. Grandfather has history or alcohol abuse . Family History  Problem Relation Age of Onset  . Mental illness Mother   . Depression Mother   . Diabetes Father     type 2  . Arthritis Maternal Grandmother   . Hyperlipidemia Maternal Grandmother   . Heart disease Maternal Grandmother   . Alcohol abuse Maternal Grandfather   . Hypertension Maternal Grandfather   . Diabetes Paternal Grandmother   .  Cancer Other     lung  . Heart disease Other    Social History: single,no children, has boyfriend, states her mother, step father do not approve of him and this has led to a lot of stress- see above . Works at a [employer]. Graduated HS- one semester of college. Denies legal issues .  History  Alcohol Use  . Yes    Comment: Occasional drink liquor or wine     History  Drug Use  . Yes  . Special:  Marijuana    Comment: IUD    History   Social History  . Marital Status: Single    Spouse Name: N/A  . Number of Children: N/A  . Years of Education: N/A   Social History Main Topics  . Smoking status: Never Smoker   . Smokeless tobacco: Not on file  . Alcohol Use: Yes     Comment: Occasional drink liquor or wine  . Drug Use: Yes    Special: Marijuana     Comment: IUD  . Sexual Activity: Yes   Other Topics Concern  . None   Social History Narrative   Additional Social History:    Pain Medications: over the counter tylenol Prescriptions: proventil   adderall XR 20 mg   epipen   prozac 20 mg   trazadone 50 mg Over the Counter: over counter tylenol History of alcohol / drug use?: Yes Longest period of sobriety (when/how long): weeks Negative Consequences of Use: Financial Withdrawal Symptoms: Other (Comment) (denied withdrawals at this time) Name of Substance 1: THC 1 - Age of First Use: started in 2014 1 - Amount (size/oz): l bowl 1 - Frequency: 2 times week 1 - Duration: over 2 years 1 - Last Use / Amount: 06/16/2015   Musculoskeletal: Strength & Muscle Tone: within normal limits Gait & Station: normal Patient leans: N/A  Psychiatric Specialty Exam: Physical Exam  Review of Systems  Constitutional: Negative.   HENT: Negative.   Eyes: Negative.   Respiratory: Negative.   Cardiovascular: Negative.   Gastrointestinal: Positive for heartburn. Negative for nausea, vomiting and blood in stool.  Genitourinary: Negative.   Musculoskeletal: Negative.   Skin: Negative.   Neurological: Negative.  Negative for seizures.  Endo/Heme/Allergies: Negative.   Psychiatric/Behavioral: Positive for suicidal ideas.  all other systems negative   Blood pressure 117/74, pulse 69, temperature 97.6 F (36.4 C), temperature source Oral, resp. rate 16, height 5' 4.25" (1.632 m), weight 186 lb (84.369 kg).Body mass index is 31.68 kg/(m^2).  General Appearance: Well Groomed  Chemical engineer::  Good  Speech:  Normal Rate  Volume:  Normal  Mood:  minimizes depression at this time- states " mood is OK"   Affect:  mildly constricted but reactive   Thought Process:  Goal Directed and Linear  Orientation:  Other:  fully alert and attentive   Thought Content:  denies hallucinations, no delusions, not internally preoccupied   Suicidal Thoughts:  No- denies any thoughts of hurting herself or of SI.   Homicidal Thoughts:  No- specifically also denies any thoughts of HI towards mother, step father .  Memory:  recent and remote grossly intact   Judgement:  Fair  Insight:  Fair  Psychomotor Activity:  Normal  Concentration:  Good  Recall:  Good  Fund of Knowledge:Good  Language: Good  Akathisia:  Negative  Handed:  Right  AIMS (if indicated):     Assets:  Desire for Improvement Physical Health Resilience  ADL's:  Fair   Cognition: WNL  Sleep:      Risk to Self: Is patient at risk for suicide?: No What has been your use of drugs/alcohol within the last 12 months?: Marijuana and Alcohol Risk to Others:   Prior Inpatient Therapy:   Prior Outpatient Therapy:    Alcohol Screening: 1. How often do you have a drink containing alcohol?: Monthly or less 2. How many drinks containing alcohol do you have on a typical day when you are drinking?: 1 or 2 3. How often do you have six or more drinks on one occasion?: Never Preliminary Score: 0 9. Have you or someone else been injured as a result of your drinking?: No 10. Has a relative or friend or a doctor or another health worker been concerned about your drinking or suggested you cut down?: No Alcohol Use Disorder Identification Test Final Score (AUDIT): 1 Brief Intervention: Yes  Allergies:   Allergies  Allergen Reactions  . Sulfa Antibiotics Other (See Comments)    Turned skin red  . Shellfish Allergy Swelling   Lab Results:  Results for orders placed or performed during the hospital encounter of 06/16/15 (from the  past 48 hour(s))  CBC     Status: None   Collection Time: 06/16/15 11:30 PM  Result Value Ref Range   WBC 7.3 4.0 - 10.5 K/uL   RBC 4.54 3.87 - 5.11 MIL/uL   Hemoglobin 13.0 12.0 - 15.0 g/dL   HCT 38.8 36.0 - 46.0 %   MCV 85.5 78.0 - 100.0 fL   MCH 28.6 26.0 - 34.0 pg   MCHC 33.5 30.0 - 36.0 g/dL   RDW 12.4 11.5 - 15.5 %   Platelets 262 150 - 400 K/uL  Comprehensive metabolic panel     Status: Abnormal   Collection Time: 06/16/15 11:30 PM  Result Value Ref Range   Sodium 137 135 - 145 mmol/L   Potassium 3.4 (L) 3.5 - 5.1 mmol/L   Chloride 107 101 - 111 mmol/L   CO2 23 22 - 32 mmol/L   Glucose, Bld 109 (H) 65 - 99 mg/dL   BUN 14 6 - 20 mg/dL   Creatinine, Ser 0.64 0.44 - 1.00 mg/dL   Calcium 8.9 8.9 - 10.3 mg/dL   Total Protein 7.1 6.5 - 8.1 g/dL   Albumin 4.3 3.5 - 5.0 g/dL   AST 23 15 - 41 U/L   ALT 28 14 - 54 U/L   Alkaline Phosphatase 98 38 - 126 U/L   Total Bilirubin 0.8 0.3 - 1.2 mg/dL   GFR calc non Af Amer >60 >60 mL/min   GFR calc Af Amer >60 >60 mL/min    Comment: (NOTE) The eGFR has been calculated using the CKD EPI equation. This calculation has not been validated in all clinical situations. eGFR's persistently <60 mL/min signify possible Chronic Kidney Disease.    Anion gap 7 5 - 15  Ethanol (ETOH)     Status: None   Collection Time: 06/16/15 11:30 PM  Result Value Ref Range   Alcohol, Ethyl (B) <5 <5 mg/dL    Comment:        LOWEST DETECTABLE LIMIT FOR SERUM ALCOHOL IS 5 mg/dL FOR MEDICAL PURPOSES ONLY   Acetaminophen level     Status: Abnormal   Collection Time: 06/16/15 11:30 PM  Result Value Ref Range   Acetaminophen (Tylenol), Serum <10 (L) 10 - 30 ug/mL    Comment:        THERAPEUTIC CONCENTRATIONS VARY  SIGNIFICANTLY. A RANGE OF 10-30 ug/mL MAY BE AN EFFECTIVE CONCENTRATION FOR MANY PATIENTS. HOWEVER, SOME ARE BEST TREATED AT CONCENTRATIONS OUTSIDE THIS RANGE. ACETAMINOPHEN CONCENTRATIONS >150 ug/mL AT 4 HOURS AFTER INGESTION AND >50  ug/mL AT 12 HOURS AFTER INGESTION ARE OFTEN ASSOCIATED WITH TOXIC REACTIONS.   Salicylate level     Status: None   Collection Time: 06/16/15 11:30 PM  Result Value Ref Range   Salicylate Lvl <2.5 2.8 - 30.0 mg/dL  Urine rapid drug screen (hosp performed)not at [facility]     Status: Abnormal   Collection Time: 06/16/15 11:30 PM  Result Value Ref Range   Opiates NONE DETECTED NONE DETECTED   Cocaine NONE DETECTED NONE DETECTED   Benzodiazepines NONE DETECTED NONE DETECTED   Amphetamines POSITIVE (A) NONE DETECTED   Tetrahydrocannabinol POSITIVE (A) NONE DETECTED   Barbiturates NONE DETECTED NONE DETECTED    Comment:        DRUG SCREEN FOR MEDICAL PURPOSES ONLY.  IF CONFIRMATION IS NEEDED FOR ANY PURPOSE, NOTIFY LAB WITHIN 5 DAYS.        LOWEST DETECTABLE LIMITS FOR URINE DRUG SCREEN Drug Class       Cutoff (ng/mL) Amphetamine      1000 Barbiturate      200 Benzodiazepine   053 Tricyclics       976 Opiates          300 Cocaine          300 THC              50   POC Urine Pregnancy, ED (do NOT order at [facility])     Status: None   Collection Time: 06/16/15 11:39 PM  Result Value Ref Range   Preg Test, Ur NEGATIVE NEGATIVE    Comment:        THE SENSITIVITY OF THIS METHODOLOGY IS >24 mIU/mL    Current Medications: Current Facility-Administered Medications  Medication Dose Route Frequency Provider Last Rate Last Dose  . acetaminophen (TYLENOL) tablet 650 mg  650 mg Oral Q4H PRN Patrecia Pour, NP   650 mg at 06/17/15 2341  . albuterol (PROVENTIL HFA;VENTOLIN HFA) 108 (90 BASE) MCG/ACT inhaler 1 puff  1 puff Inhalation Q6H PRN Patrecia Pour, NP      . alum & mag hydroxide-simeth (MAALOX/MYLANTA) 200-200-20 MG/5ML suspension 30 mL  30 mL Oral Q4H PRN Patrecia Pour, NP      . FLUoxetine (PROZAC) capsule 20 mg  20 mg Oral Daily Patrecia Pour, NP   20 mg at 06/18/15 0749  . LORazepam (ATIVAN) tablet 1 mg  1 mg Oral Q8H PRN Patrecia Pour, NP      . magnesium hydroxide (MILK OF  MAGNESIA) suspension 30 mL  30 mL Oral Daily PRN Patrecia Pour, NP      . ondansetron [facility]) tablet 4 mg  4 mg Oral Q8H PRN Patrecia Pour, NP       PTA Medications: Prescriptions prior to admission  Medication Sig Dispense Refill Last Dose  . albuterol (PROVENTIL HFA;VENTOLIN HFA) 108 (90 BASE) MCG/ACT inhaler Inhale 1 puff into the lungs every 6 (six) hours as needed for wheezing or shortness of breath.   unknown at unknown  . amphetamine-dextroamphetamine (ADDERALL XR) 20 MG 24 hr capsule Take 1 capsule (20 mg total) by mouth daily. 30 capsule 0 06/16/2015 at Unknown time  . EPINEPHrine (EPIPEN 2-PAK) 0.3 mg/0.3 mL IJ SOAJ injection Inject 0.3 mg into the muscle once.  unknown at unknown  . FLUoxetine (PROZAC) 20 MG tablet Take 1 tablet (20 mg total) by mouth daily. 30 tablet 3 06/16/2015 at Unknown time  . traZODone (DESYREL) 50 MG tablet Take 0.5-1 tablets (25-50 mg total) by mouth at bedtime as needed for sleep. (Patient not taking: Reported on 01/02/2015) 30 tablet 3 Not Taking    Previous Psychotropic Medications:  States she been on Prozac x 1 year- she states " it helps stabilize me", but does not cause side effects. On Adderall XR x 6 months for ADHD .  Substance Abuse History in the last 12 months:  Denies any drug abuse , denies alcohol abuse, denies abusing adderall, states she occasionally smokes cannabis .    Consequences of Substance Abuse: denies   Results for orders placed or performed during the hospital encounter of 06/16/15 (from the past 72 hour(s))  CBC     Status: None   Collection Time: 06/16/15 11:30 PM  Result Value Ref Range   WBC 7.3 4.0 - 10.5 K/uL   RBC 4.54 3.87 - 5.11 MIL/uL   Hemoglobin 13.0 12.0 - 15.0 g/dL   HCT 38.8 36.0 - 46.0 %   MCV 85.5 78.0 - 100.0 fL   MCH 28.6 26.0 - 34.0 pg   MCHC 33.5 30.0 - 36.0 g/dL   RDW 12.4 11.5 - 15.5 %   Platelets 262 150 - 400 K/uL  Comprehensive metabolic panel     Status: Abnormal   Collection Time: 06/16/15  11:30 PM  Result Value Ref Range   Sodium 137 135 - 145 mmol/L   Potassium 3.4 (L) 3.5 - 5.1 mmol/L   Chloride 107 101 - 111 mmol/L   CO2 23 22 - 32 mmol/L   Glucose, Bld 109 (H) 65 - 99 mg/dL   BUN 14 6 - 20 mg/dL   Creatinine, Ser 0.64 0.44 - 1.00 mg/dL   Calcium 8.9 8.9 - 10.3 mg/dL   Total Protein 7.1 6.5 - 8.1 g/dL   Albumin 4.3 3.5 - 5.0 g/dL   AST 23 15 - 41 U/L   ALT 28 14 - 54 U/L   Alkaline Phosphatase 98 38 - 126 U/L   Total Bilirubin 0.8 0.3 - 1.2 mg/dL   GFR calc non Af Amer >60 >60 mL/min   GFR calc Af Amer >60 >60 mL/min    Comment: (NOTE) The eGFR has been calculated using the CKD EPI equation. This calculation has not been validated in all clinical situations. eGFR's persistently <60 mL/min signify possible Chronic Kidney Disease.    Anion gap 7 5 - 15  Ethanol (ETOH)     Status: None   Collection Time: 06/16/15 11:30 PM  Result Value Ref Range   Alcohol, Ethyl (B) <5 <5 mg/dL    Comment:        LOWEST DETECTABLE LIMIT FOR SERUM ALCOHOL IS 5 mg/dL FOR MEDICAL PURPOSES ONLY   Acetaminophen level     Status: Abnormal   Collection Time: 06/16/15 11:30 PM  Result Value Ref Range   Acetaminophen (Tylenol), Serum <10 (L) 10 - 30 ug/mL    Comment:        THERAPEUTIC CONCENTRATIONS VARY SIGNIFICANTLY. A RANGE OF 10-30 ug/mL MAY BE AN EFFECTIVE CONCENTRATION FOR MANY PATIENTS. HOWEVER, SOME ARE BEST TREATED AT CONCENTRATIONS OUTSIDE THIS RANGE. ACETAMINOPHEN CONCENTRATIONS >150 ug/mL AT 4 HOURS AFTER INGESTION AND >50 ug/mL AT 12 HOURS AFTER INGESTION ARE OFTEN ASSOCIATED WITH TOXIC REACTIONS.   Salicylate level  Status: None   Collection Time: 06/16/15 11:30 PM  Result Value Ref Range   Salicylate Lvl <1.1 2.8 - 30.0 mg/dL  Urine rapid drug screen (hosp performed)not at [facility]     Status: Abnormal   Collection Time: 06/16/15 11:30 PM  Result Value Ref Range   Opiates NONE DETECTED NONE DETECTED   Cocaine NONE DETECTED NONE DETECTED    Benzodiazepines NONE DETECTED NONE DETECTED   Amphetamines POSITIVE (A) NONE DETECTED   Tetrahydrocannabinol POSITIVE (A) NONE DETECTED   Barbiturates NONE DETECTED NONE DETECTED    Comment:        DRUG SCREEN FOR MEDICAL PURPOSES ONLY.  IF CONFIRMATION IS NEEDED FOR ANY PURPOSE, NOTIFY LAB WITHIN 5 DAYS.        LOWEST DETECTABLE LIMITS FOR URINE DRUG SCREEN Drug Class       Cutoff (ng/mL) Amphetamine      1000 Barbiturate      200 Benzodiazepine   031 Tricyclics       594 Opiates          300 Cocaine          300 THC              50   POC Urine Pregnancy, ED (do NOT order at [facility])     Status: None   Collection Time: 06/16/15 11:39 PM  Result Value Ref Range   Preg Test, Ur NEGATIVE NEGATIVE    Comment:        THE SENSITIVITY OF THIS METHODOLOGY IS >24 mIU/mL     Observation Level/Precautions:  15 minute checks  Laboratory:  If needed   Psychotherapy:  Milieu, group therapy  Medications:  Continue Prozac 20 mgrs QDAY , Ativan PRNS for anxiety, if needed   Consultations:  If  needed   Discharge Concerns:  - poor relationship with mother, stepfather at this time-  Estimated LOS: 5 days   Other:     Psychological Evaluations: No   Treatment Plan Summary: Daily contact with patient to assess and evaluate symptoms and progress in treatment, Medication management, Plan inpatient admission and medications as below  Medical Decision Making:  Review of Psycho-Social Stressors (1), Review or order clinical lab tests (1), Established Problem, Worsening (2) and Review of Medication Regimen & Side Effects (2)  I certify that inpatient services furnished can reasonably be expected to improve the patient's condition.   Vanessia Bokhari 7/6/20165:16 PM
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
## 2015-06-18 NOTE — Progress Notes (Addendum)
Patient ID: Claire Rivas, female   DOB: 10/08/1991, 24 y.o.   MRN: [id number] D  ---  Pt. Denies pain or dis- comfort at this time.  She is friendly and pleasant to staff and peers.   Pt is now out on unit interacting, but spends most of time in room in bed and isolating self.   Pt. agrees to contract for safety.  --- A ---  Support, safety and meds as ordered  -- R --  Pt. Safe on unit

## 2015-06-19 DIAGNOSIS — F329 Major depressive disorder, single episode, unspecified: Secondary | ICD-10-CM

## 2015-06-19 DIAGNOSIS — R45851 Suicidal ideations: Secondary | ICD-10-CM

## 2015-06-19 DIAGNOSIS — F419 Anxiety disorder, unspecified: Secondary | ICD-10-CM

## 2015-06-19 NOTE — Progress Notes (Signed)
Pt attended karaoke but returned with roommate because she did not want her to be alone.

## 2015-06-19 NOTE — Progress Notes (Signed)
Report given to Vidal SchwalbeSandra Johnson.

## 2015-06-19 NOTE — Progress Notes (Signed)
[facility] MD Progress Note  06/19/2015 5:46 PM Claire Rivas  MRN:  [id number] Subjective:  "Dr Jama Flavors said I could go home today.  I feel fine." Objective:  Patient was admitted for making threats that she was going to cut herself.  He parents don't approve of her BF.  She states that it was all circumstantital.  Her parents stressed her out and she had no intention of hurting herself.  "I care enough of my younger sister to have her see me hurt myself." She denies any adverse drug reactions and she is tolerating her Prozac well.  She states that she will go back home and talk reasonably with her parents.   Principal Problem: Suicidal ideations Diagnosis:   Patient Active Problem List   Diagnosis Date Noted  . Depression, major, single episode, severe [F32.2] 06/17/2015  . Suicidal ideations [R45.851] 06/17/2015  . ADD (attention deficit disorder) [F90.9] 07/19/2014  . Deliberate self-cutting [F48.9] 07/19/2014   Total Time spent with patient: 45 minutes   Past Medical History:  Past Medical History  Diagnosis Date  . Asthma   . History of chicken pox   . Depression   . Frequent headaches   . GERD (gastroesophageal reflux disease)   . Allergy   . Anxiety     Past Surgical History  Procedure Laterality Date  . Wisdom tooth extraction     Family History:  Family History  Problem Relation Age of Onset  . Mental illness Mother   . Depression Mother   . Diabetes Father     type 2  . Arthritis Maternal Grandmother   . Hyperlipidemia Maternal Grandmother   . Heart disease Maternal Grandmother   . Alcohol abuse Maternal Grandfather   . Hypertension Maternal Grandfather   . Diabetes Paternal Grandmother   . Cancer Other     lung  . Heart disease Other    Social History:  History  Alcohol Use  . Yes    Comment: Occasional drink liquor or wine     History  Drug Use  . Yes  . Special: Marijuana    Comment: IUD    History   Social History  . Marital Status: Single   Spouse Name: N/A  . Number of Children: N/A  . Years of Education: N/A   Social History Main Topics  . Smoking status: Never Smoker   . Smokeless tobacco: Not on file  . Alcohol Use: Yes     Comment: Occasional drink liquor or wine  . Drug Use: Yes    Special: Marijuana     Comment: IUD  . Sexual Activity: Yes   Other Topics Concern  . None   Social History Narrative   Additional History:    Sleep: Good  Appetite:  Good   Assessment:   Musculoskeletal: Strength & Muscle Tone: within normal limits Gait & Station: normal Patient leans: N/A   Psychiatric Specialty Exam:   Physical Exam  Vitals reviewed. Psychiatric: Her mood appears anxious. She does not exhibit a depressed mood.    Review of Systems  All other systems reviewed and are negative.   Blood pressure 108/76, pulse 75, temperature 97.7 F (36.5 C), temperature source Oral, resp. rate 18, height 5' 4.25" (1.632 m), weight 84.369 kg (186 lb).Body mass index is 31.68 kg/(m^2).   General Appearance: Casual  Eye Contact:: Fair  Speech: Normal Rate  Volume: Decreased  Mood: Depressed  Affect: Congruent  Thought Process: Clear and coherent  Orientation: Full (Time,  Place, and Person)  Thought Content: Rumination  Suicidal Thoughts: Yes. with intent/plan  Homicidal Thoughts: No  Memory: Immediate; Good Recent; Good Remote; Good  Judgement: Impaired  Insight: Fair  Psychomotor Activity: Decreased  Concentration: Fair  Recall: Fair  Fund of Knowledge:Good  Language: Good  Akathisia: No  Handed: Right  AIMS (if indicated):    Assets: Housing Leisure Time Physical Health Resilience Social Support  ADL's: Intact  Cognition: WNL  Sleep:         Current Medications: Current Facility-Administered Medications  Medication Dose Route Frequency Provider Last Rate Last Dose  . acetaminophen (TYLENOL) tablet 650 mg  650 mg Oral Q4H PRN Charm RingsJamison  Y Lord, NP   650 mg at 06/17/15 2341  . albuterol (PROVENTIL HFA;VENTOLIN HFA) 108 (90 BASE) MCG/ACT inhaler 1 puff  1 puff Inhalation Q6H PRN Charm RingsJamison Y Lord, NP      . alum & mag hydroxide-simeth (MAALOX/MYLANTA) 200-200-20 MG/5ML suspension 30 mL  30 mL Oral Q4H PRN Charm RingsJamison Y Lord, NP      . FLUoxetine (PROZAC) capsule 20 mg  20 mg Oral Daily Charm RingsJamison Y Lord, NP   20 mg at 06/19/15 0809  . LORazepam (ATIVAN) tablet 1 mg  1 mg Oral Q8H PRN Charm RingsJamison Y Lord, NP      . magnesium hydroxide (MILK OF MAGNESIA) suspension 30 mL  30 mL Oral Daily PRN Charm RingsJamison Y Lord, NP      . ondansetron [facility](ZOFRAN) tablet 4 mg  4 mg Oral Q8H PRN Charm RingsJamison Y Lord, NP        Lab Results: No results found for this or any previous visit (from the past 48 hour(s)).  Physical Findings: AIMS: Facial and Oral Movements Muscles of Facial Expression: None, normal Lips and Perioral Area: None, normal Jaw: None, normal Tongue: None, normal,Extremity Movements Upper (arms, wrists, hands, fingers): None, normal Lower (legs, knees, ankles, toes): None, normal, Trunk Movements Neck, shoulders, hips: None, normal, Overall Severity Severity of abnormal movements (highest score from questions above): None, normal Incapacitation due to abnormal movements: None, normal Patient's awareness of abnormal movements (rate only patient's report): No Awareness, Dental Status Current problems with teeth and/or dentures?: No Does patient usually wear dentures?: No  CIWA:  CIWA-Ar Total: 1 COWS:  COWS Total Score: 2  Treatment Plan Summary: Review of chart, vital signs, medications, and notes.  1-Individual and group therapy  2-Medication management for depression and anxiety: Medications reviewed with the patient and she stated no untoward effects, unchanged.  3-Coping skills for depression, anxiety  4-Continue crisis stabilization and management  5-Address health issues--monitoring vital signs, stable  6-Treatment plan in progress to  prevent relapse of depression and anxiety  Medical Decision Making: Review of Psycho-Social Stressors (1), Review or order clinical lab tests (1) and Review of Medication Regimen & Side Effects (2)  Velna HatchetSheila May Agustin AGNP-BC 06/19/2015, 5:46 PM I agree with assessment and plan Madie RenoIrving A. Dub MikesLugo, M.D.

## 2015-06-19 NOTE — Progress Notes (Signed)
Patient ID: Claire Rivas, female   DOB: 06/12/1991, 24 y.o.   MRN: [id number] D: Client visible on the unit, in dayroom watching TV, interacts appropriately with staff and peers. Client reports a disagreement with parents is what precipitated her admission. "I decided to come voluntarily, even then my mom tried to have me committed because she's controlling like that" Client reports she took out charges against the BF and parents don't like him. "I told them it was false charges" Client reports parents gave her a choice them or him. "I hate they made me choose, I'm not going back to live with them" "I'm going to live with my BF"  Client currently denies SI. A: Writer provided emotional support, encouraged client to consider why parents think he is not right for her and talk to them about it. Writer encouraged client to continue medication regime and medications. Staff will monitor q1915min for safety. R: Client is safe on the unit, attended karaoke, but returned early complaining that music was too loud.

## 2015-06-19 NOTE — [facility] Group Notes (Signed)
[facility] Group Notes:  (Nursing/MHT/Case Management/Adjunct)  Date:  06/19/2015  Time: 0915   Type of Therapy:  Psychoeducational Skills  Participation Level:  Active  Participation Quality:  Appropriate and Attentive  Affect:  Anxious  Cognitive:  Alert  Insight:  Improving  Engagement in Group:  Resistant  Modes of Intervention:  Support  Summary of Progress/Problems: Patient is focused on discharge.  Denies any depressive symptoms.  Cranford MonBeaudry, Kedron Uno Evans 06/19/2015, 12:50 PM

## 2015-06-19 NOTE — Progress Notes (Signed)
D: Patient continues to deny and depressive symptoms.  She denies SI/HI/AVH.  She is focused on being discharged today.  She presents with flat affect.  She has fixed smile at times.  Her affect is not congruent with her stated symptoms.  She attended group with minimal interaction. A: Continue to monitor medication management and MD orders.  Safety checks completed every 15 minutes per protocol.  Offer support and encouragement as needed. R: Patient has minimal interaction with staff.

## 2015-06-19 NOTE — Progress Notes (Signed)
D: Pt who is alert and oriented x 4 denies any form of depression or anxiety; Pt non-verbal behaviors however states otherwise-avoids eye contacts, unable to sit still, flat affect. Pt is hoping the doctor would let her leave on Thursday. Pt also denies SI/HI/AVH. A: Medications administered as prescribed.  Support, encouragement and safe environment provided. 15-minute safety checks continue. R: Pt was med compliant. Safety checks continue.

## 2015-06-19 NOTE — [facility] Group Notes (Addendum)
[facility] LCSW Group Therapy 06/19/2015 1:15 PM Type of Therapy: Group Therapy Participation Level: Active  Participation Quality: Attentive, Sharing and Supportive  Affect: Appropriate  Cognitive: Alert and Oriented  Insight: Developing/Improving and Engaged  Engagement in Therapy: Developing/Improving and Engaged  Modes of Intervention: Activity, Clarification, Confrontation, Discussion, Education, Exploration, Limit-setting, Orientation, Problem-solving, Rapport Building, Reality Testing, Socialization and Support  Summary of Progress/Problems: Patient was attentive and engaged with speaker from [employer]. Patient was attentive to speaker while they shared their story of dealing with mental health and overcoming it. Patient expressed interest in their programs and services and received information on their agency. Patient processed ways they can relate to the speaker.   Ottilia Pippenger, MSW, LCSWA Clinical Social Worker [facility] [phone]   

## 2015-06-20 ENCOUNTER — Encounter (HOSPITAL_COMMUNITY): Payer: Self-pay | Admitting: Registered Nurse

## 2015-06-20 DIAGNOSIS — F329 Major depressive disorder, single episode, unspecified: Secondary | ICD-10-CM | POA: Diagnosis present

## 2015-06-20 MED ORDER — EPINEPHRINE 0.3 MG/0.3ML IJ SOAJ: 0.3000 mg | Freq: Once | INTRAMUSCULAR | Status: DC

## 2015-06-20 MED ORDER — ACETAMINOPHEN 325 MG PO TABS: 650.0000 mg | ORAL_TABLET | Freq: Four times a day (QID) | ORAL | Status: DC | PRN

## 2015-06-20 MED ORDER — MAGNESIUM HYDROXIDE 400 MG/5ML PO SUSP: 30.0000 mL | Freq: Every day | ORAL | Status: DC | PRN

## 2015-06-20 MED ORDER — FLUOXETINE HCL 20 MG PO CAPS: 20.0000 mg | ORAL_CAPSULE | Freq: Every day | ORAL | Status: DC

## 2015-06-20 MED ORDER — ALBUTEROL SULFATE HFA 108 (90 BASE) MCG/ACT IN AERS: 1.0000 | INHALATION_SPRAY | Freq: Four times a day (QID) | RESPIRATORY_TRACT | Status: AC | PRN

## 2015-06-20 NOTE — [facility] Suicide Risk Assessment (Signed)
[facility] INPATIENT:  Family/Significant Other Suicide Prevention Education  Suicide Prevention Education:  Contact Attempts: Mother Karl Balesngela Rector [phone], (name of family member/significant other) has been identified by the patient as the family member/significant other with whom the patient will be residing, and identified as the person(s) who will aid the patient in the event of a mental health crisis.  With written consent from the patient, two attempts were made to provide suicide prevention education, prior to and/or following the patient's discharge.  We were unsuccessful in providing suicide prevention education.  A suicide education pamphlet was given to the patient to share with family/significant other.  Date and time of first attempt: 06/20/15 at 12:30 pm Date and time of second attempt: 06/20/15 at 3:30pm  Claire Rivas, Claire Rivas 06/20/2015, 12:29 PM

## 2015-06-20 NOTE — Discharge Summary (Signed)
Physician Discharge Summary Note  Patient:  Claire Rivas is an 24 y.o., female MRN:  [id number] DOB:  1991/08/11 Patient phone:  [phone] (home)  Patient address:   [address] [place] [address],  Total Time spent with patient: 45 minutes  Date of Admission:  06/17/2015 Date of Discharge: 06/20/2015  Reason for Admission:  Per H&P Note:  24 year old single female, who reports having increased tension at home, particularly with her step father and mother. States that they do not approve of her boyfriend, and that they have told her to " choose between your family and your boyfriend". Also , has felt hurt emotionally from her step father telling her she could not use his last name and had to continue using her biological father's name. In the context of these stressors she felt more depressed and she developed Self injurious ideations, with thoughts of cutting . She states " I have a lot to live for and I did not want to hurt myself".  States that her parents told her she had to come to the hospital " or they were going to commit me ".  Elements: Patient reports worsening psychosocial stressors, primarily related to her interactions with her step father, mother , related to their disapproval of her boyfriend. Developed thoughts of cutting self and came to   Principal Problem: Suicidal ideations Discharge Diagnoses: Patient Active Problem List   Diagnosis Date Noted  . MDD (major depressive disorder) [F32.2] 06/20/2015  . Depression, major, single episode, severe [F32.2] 06/17/2015  . Suicidal ideations [R45.851] 06/17/2015  . ADD (attention deficit disorder) [F90.9] 07/19/2014  . Deliberate self-cutting [F48.9] 07/19/2014    Musculoskeletal: Strength & Muscle Tone: within normal limits Gait & Station: normal Patient leans: N/A  Psychiatric Specialty Exam:  See Suicide Risk Assessment Physical Exam  Constitutional: She is oriented to person, place, and time.  Neck:  Normal range of motion.  Respiratory: Effort normal.  Musculoskeletal: Normal range of motion.  Neurological: She is alert and oriented to person, place, and time.    Review of Systems  Psychiatric/Behavioral: Negative for suicidal ideas and hallucinations. Depression: Stable. Nervous/anxious: stable. Insomnia: Stable.   All other systems reviewed and are negative.   Blood pressure 123/64, pulse 76, temperature 97.7 F (36.5 C), temperature source Oral, resp. rate 20, height 5' 4.25" (1.632 m), weight 84.369 kg (186 lb).Body mass index is 31.68 kg/(m^2).  Have you used any form of tobacco in the last 30 days? (Cigarettes, Smokeless Tobacco, Cigars, and/or Pipes): No  Has this patient used any form of tobacco in the last 30 days? (Cigarettes, Smokeless Tobacco, Cigars, and/or Pipes) No  Past Medical History:  Past Medical History  Diagnosis Date  . Asthma   . History of chicken pox   . Depression   . Frequent headaches   . GERD (gastroesophageal reflux disease)   . Allergy   . Anxiety     Past Surgical History  Procedure Laterality Date  . Wisdom tooth extraction     Family History:  Family History  Problem Relation Age of Onset  . Mental illness Mother   . Depression Mother   . Diabetes Father     type 2  . Arthritis Maternal Grandmother   . Hyperlipidemia Maternal Grandmother   . Heart disease Maternal Grandmother   . Alcohol abuse Maternal Grandfather   . Hypertension Maternal Grandfather   . Diabetes Paternal Grandmother   . Cancer Other     lung  .  Heart disease Other    Social History:  History  Alcohol Use  . Yes    Comment: Occasional drink liquor or wine     History  Drug Use  . Yes  . Special: Marijuana    Comment: IUD    History   Social History  . Marital Status: Single    Spouse Name: N/A  . Number of Children: N/A  . Years of Education: N/A   Social History Main Topics  . Smoking status: Never Smoker   . Smokeless tobacco: Not on file   . Alcohol Use: Yes     Comment: Occasional drink liquor or wine  . Drug Use: Yes    Special: Marijuana     Comment: IUD  . Sexual Activity: Yes   Other Topics Concern  . None   Social History Narrative   Risk to Self: Is patient at risk for suicide?: No What has been your use of drugs/alcohol within the last 12 months?: Marijuana and Alcohol Risk to Others:   Prior Inpatient Therapy:   Prior Outpatient Therapy:    Level of Care:  OP  Hospital Course:  Claire Rivas was admitted for Suicidal ideations and crisis management.  She was treated discharged with the medications listed below under Medication List.  Medical problems were identified and treated as needed.  Home medications were restarted as appropriate.  Improvement was monitored by observation and Claire Rivas daily report of symptom reduction.  Emotional and mental status was monitored by daily self-inventory reports completed by Claire Rivas and clinical staff.         Claire Rivas was evaluated by the treatment team for stability and plans for continued recovery upon discharge.  Claire Rivas motivation was an integral factor for scheduling further treatment.  Employment, transportation, bed availability, health status, family support, and any pending legal issues were also considered during her hospital stay.  She was offered further treatment options upon discharge including but not limited to Residential, Intensive Outpatient, and Outpatient treatment.  Claire Rivas will follow up with the services as listed below under Follow Up Information.     Upon completion of this admission the patient was both mentally and medically stable for discharge denying suicidal/homicidal ideation, auditory/visual/tactile hallucinations, delusional thoughts and paranoia.      Consults:  psychiatry  Significant Diagnostic Studies:  labs: Pregnancy, CBC, CMET, ETOH, UDS,   Discharge Vitals:   Blood pressure 123/64, pulse 76,  temperature 97.7 F (36.5 C), temperature source Oral, resp. rate 20, height 5' 4.25" (1.632 m), weight 84.369 kg (186 lb). Body mass index is 31.68 kg/(m^2). Lab Results:   No results found for this or any previous visit (from the past 72 hour(s)).  Physical Findings: AIMS: Facial and Oral Movements Muscles of Facial Expression: None, normal Lips and Perioral Area: None, normal Jaw: None, normal Tongue: None, normal,Extremity Movements Upper (arms, wrists, hands, fingers): None, normal Lower (legs, knees, ankles, toes): None, normal, Trunk Movements Neck, shoulders, hips: None, normal, Overall Severity Severity of abnormal movements (highest score from questions above): None, normal Incapacitation due to abnormal movements: None, normal Patient's awareness of abnormal movements (rate only patient's report): No Awareness, Dental Status Current problems with teeth and/or dentures?: No Does patient usually wear dentures?: No  CIWA:  CIWA-Ar Total: 1 COWS:  COWS Total Score: 2   See Psychiatric Specialty Exam and Suicide Risk Assessment completed by Attending Physician prior to discharge.  Discharge destination:  Home  Is patient on multiple antipsychotic therapies at discharge:  No   Has Patient had three or more failed trials of antipsychotic monotherapy by history:  No    Recommended Plan for Multiple Antipsychotic Therapies: NA      Discharge Instructions    Activity as tolerated - No restrictions    Complete by:  As directed      Diet general    Complete by:  As directed      Discharge instructions    Complete by:  As directed   Take all of you medications as prescribed by your mental healthcare provider.  Report any adverse effects and reactions from your medications to your outpatient provider promptly. Do not engage in alcohol and or illegal drug use while on prescription medicines. In the event of worsening symptoms call the crisis hotline, 911, and or go to the  nearest emergency department for appropriate evaluation and treatment of symptoms. Follow-up with your primary care provider for your medical issues, concerns and or health care needs.   Keep all scheduled appointments.  If you are unable to keep an appointment call to reschedule.  Let the nurse know if you will need medications before next scheduled appointment.            Medication List    STOP taking these medications        amphetamine-dextroamphetamine 20 MG 24 hr capsule  Commonly known as:  ADDERALL XR     FLUoxetine 20 MG tablet  Commonly known as:  PROZAC  Replaced by:  FLUoxetine 20 MG capsule     traZODone 50 MG tablet  Commonly known as:  DESYREL      TAKE these medications      Indication   albuterol 108 (90 BASE) MCG/ACT inhaler  Commonly known as:  PROVENTIL HFA;VENTOLIN HFA  Inhale 1 puff into the lungs every 6 (six) hours as needed for wheezing or shortness of breath.   Indication:  Asthma     EPINEPHrine 0.3 mg/0.3 mL Soaj injection  Commonly known as:  EPIPEN 2-PAK  Inject 0.3 mLs (0.3 mg total) into the muscle once.   Indication:  Life-Threatening Allergic Reaction     FLUoxetine 20 MG capsule  Commonly known as:  PROZAC  Take 1 capsule (20 mg total) by mouth daily.   Indication:  Depression       Follow-up Information    Follow up with Neena RhymesKatherine Tabori. Go on 06/27/2015.   Why:  Patient current w this provider for medications management, next appt at 11 AM on 06/27/15   Contact information:   [facility] [address] [place], [place] [address] Phone: [phone]       Follow-up recommendations:  Activity:  As tolerated Diet:  As tolerated  Comments:   Patient has been instructed to take medications as prescribed; and report adverse effects to outpatient provider.  Follow up with primary doctor for any medical issues and If symptoms recur report to nearest emergency or crisis hot line.    Total Discharge Time: 45  minutes  Signed: Assunta FoundRankin, Shuvon, FNP-BC 06/20/2015, 11:02 AM   Patient seen, Suicide Assessment Completed.  Disposition Plan Reviewed

## 2015-06-20 NOTE — Tx Team (Signed)
Interdisciplinary Treatment Plan Update (Adult) Date: 06/20/2015   Time Reviewed: 9:30 AM  Progress in Treatment: Attending groups: Yes Participating in groups: Yes Taking medication as prescribed: Yes Tolerating medication: Yes Family/Significant other contact made: No, CSW assessing for appropriate contacts Patient understands diagnosis: Yes Discussing patient identified problems/goals with staff: Yes Medical problems stabilized or resolved: Yes Denies suicidal/homicidal ideation: Yes Issues/concerns per patient self-inventory: Yes Other:  New problem(s) identified: N/A  Discharge Plan or Barriers:   7/8: Patient plans to return home to follow up with her PCP for medication management.  Reason for Continuation of Hospitalization:  Depression Anxiety Medication Stabilization   Comments: N/A  Estimated length of stay: Discharge anticipated for today 06/20/15.  For review of initial/current patient goals, please see plan of care. Patient is a 24 year old female [facility] ideations and plan to cut her wrist with razors. Patient has a diagnosis of ADD and Major Depressive Disorder, Recurrent Severe. Patient states that her depression was exacerbated due to relational stressors between herself and her parents. Patient reports that her parents are forcing her to pick them or her boyfriend due to unspecified issues they have with him. Patient is currently employed and resides with her mother and stepfather. Patient reports at discharge she will not be returning to her mother's home but to the residence of her boyfriend. Patient does not have any outpatient providers at this time but states she has a friend who is a Veterinary surgeoncounselor that she could possibly see.   Attendees: Patient:    Family:    Physician: Dr. Jama Flavorsobos; Dr. Dub MikesLugo 06/20/2015 9:30 AM  Nursing: Mosie Epsteinreka, Sarah, Noelle RN 06/20/2015 9:30 AM  Clinical Social Worker: Belenda CruiseKristin Ysabelle Goodroe,  LCSWA 06/20/2015 9:30 AM   Other: Ronda Fairlyatherine Harrill, LCSW 06/20/2015 9:30 AM  Other: Leisa LenzValerie Enoch, Vesta [facility] Liaison 06/20/2015 9:30 AM  Other: Onnie BoerJennifer Clark, Case Manager 06/20/2015 9:30 AM  Other: Assunta FoundShuvon Rankin, May Mendel RyderAugustin, Laura Davis NP 06/20/2015 9:30 AM  Other:    Other:    Other:       Scribe for Treatment Team:  Samuella BruinKristin Keagan Anthis, MSW, [employer]CSWA [phone]

## 2015-06-20 NOTE — [facility] Group Notes (Signed)
   [facility]  06/20/2015  8:45 AM   Participation Quality: Alert, Appropriate and Oriented  Mood/Affect: Appropriate  Depression Rating: 0  Anxiety Rating: 0  Thoughts of Suicide: Pt denies SI/HI  Will you contract for safety? Yes  Current AVH: Pt denies  Plan for Discharge/Comments: Pt attended discharge planning group and actively participated in group. CSW provided pt with today's workbook. Patient plans to return home to follow up with her PCP for medication management services.  Transportation Means: Pt reports access to transportation  Supports: No supports mentioned at this time  Samuella BruinKristin Annalea Alguire, MSW, [employer]CSWA Clinical Social Worker [facility] [phone]

## 2015-06-20 NOTE — Progress Notes (Signed)
Patient ID: Claire Rivas, female   DOB: 02/22/1991, 24 y.o.   MRN: [id number]   Pt currently presents with a flat affect and cooperative behavior. Per self inventory, pt rates depression, hopelessness and anxiety at a 0. Pt's daily goal is "going home" and they intend to do so by "talk to dr. Jama Flavorscobos." Pt reports good sleep, good concentration, normal energy and a good appetite. When asked about discharge plans pt states "I'm going to be picked up by my dad and then go to be with my boyfriend." Pt asked about follow up therapy and medication management and pt states "Oh, yeah, I'm just going to follow up with my doctor at home."   Pt provided with medications per providers orders. Pt's labs and vitals were monitored throughout the day. Pt supported emotionally and encouraged to express concerns and questions. Pt educated on medications and suicide prevention/interventions.  Pt's safety ensured with 15 minute and environmental checks. Pt currently denies SI/HI and A/V hallucinations. Pt verbally agrees to seek staff if SI/HI or A/VH occurs and to consult with staff before acting on these thoughts. Will continue POC. Pt to be discharged today. Pt states "I'm ready to go home."
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
## 2015-06-20 NOTE — Progress Notes (Signed)
  [facility] :  Will you be returning to the same living situation after discharge:  Yes,  Patient discharged home with father At discharge, do you have transportation home?: Yes,  patient reports access to transportation Do you have the ability to pay for your medications: Yes,  patient will be provided with medication samples and prescriptions  Release of information consent forms completed and in the chart;  Patient's signature needed at discharge.  Patient to Follow up at: Follow-up Information    Follow up with Neena RhymesKatherine Tabori. Go on 06/27/2015.   Why:  Patient current w this provider for medications management, next appt at 11 AM on 06/27/15   Contact information:   [facility] [address] [place], [place] [address] Phone: [phone]       Patient denies SI/HI: Yes,  denies    Safety Planning and Suicide Prevention discussed: Yes,  with patient  Have you used any form of tobacco in the last 30 days? (Cigarettes, Smokeless Tobacco, Cigars, and/or Pipes): No  Has patient been referred to the Quitline?: N/A patient is not a smoker  Khailee Mick, [place] CarboKristin L 06/20/2015, 3:33 PM

## 2015-06-20 NOTE — Progress Notes (Signed)
Patient ID: Claire Rivas, female   DOB: 11/13/1991, 24 y.o.   MRN: [id number]  Pt discharged home with her father. Pt was stable and appreciative at the time of discharged. All papers and prescriptions were given and valuables returned. Verbal understanding expressed. Denies SI/HI and A/VH. Pt given opportunity to express concerns and ask questions.

## 2015-06-23 ENCOUNTER — Telehealth: Payer: Self-pay | Admitting: *Deleted

## 2015-06-23 NOTE — Telephone Encounter (Signed)
Unable to reach patient at time of TCM Call. Left message for patient to return call when available.  

## 2015-06-27 ENCOUNTER — Ambulatory Visit (INDEPENDENT_AMBULATORY_CARE_PROVIDER_SITE_OTHER): Payer: Commercial Managed Care - PPO | Admitting: Family Medicine

## 2015-06-27 ENCOUNTER — Encounter: Payer: Self-pay | Admitting: Family Medicine

## 2015-06-27 VITALS — BP 112/70 | HR 105 | Temp 98.1°F | Resp 16 | Ht 64.0 in | Wt 190.1 lb

## 2015-06-27 DIAGNOSIS — F322 Major depressive disorder, single episode, severe without psychotic features: Secondary | ICD-10-CM

## 2015-06-27 DIAGNOSIS — F909 Attention-deficit hyperactivity disorder, unspecified type: Secondary | ICD-10-CM | POA: Diagnosis not present

## 2015-06-27 DIAGNOSIS — F988 Other specified behavioral and emotional disorders with onset usually occurring in childhood and adolescence: Secondary | ICD-10-CM

## 2015-06-27 MED ORDER — AMPHETAMINE-DEXTROAMPHET ER 20 MG PO CP24: 20.0000 mg | ORAL_CAPSULE | Freq: Every day | ORAL | Status: DC
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
## 2015-06-27 NOTE — Progress Notes (Signed)
Pre visit review using our clinic review tool, if applicable. No additional management support is needed unless otherwise documented below in the visit note. 

## 2015-06-27 NOTE — Patient Instructions (Signed)
Follow up in 4-6 weeks to touch base on your situation Continue the Prozac daily Follow up with your counselor If anything changes or if you need anything- let us know! Hang in there!!!
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
## 2015-06-27 NOTE — Assessment & Plan Note (Signed)
Pt reports she discussed restarting Adderall prior to leaving Behavioral Health and that her physician was ok w/ this- despite it saying to stop taking on D/C Summary.  Her med rec on D/C summary is fairly confusing and pt does have better focus while on medication- which she will need to find a new job.  Will restart medication but cautioned pt that if she has any change in mood, she needs to alert me immediately.  Pt expressed understanding and is in agreement w/ plan.

## 2015-09-25 ENCOUNTER — Ambulatory Visit (INDEPENDENT_AMBULATORY_CARE_PROVIDER_SITE_OTHER): Payer: Commercial Managed Care - PPO | Admitting: Family Medicine

## 2015-09-25 ENCOUNTER — Encounter: Payer: Self-pay | Admitting: Family Medicine

## 2015-09-25 VITALS — BP 118/74 | HR 86 | Temp 98.0°F | Resp 16 | Wt 194.1 lb

## 2015-09-25 DIAGNOSIS — F322 Major depressive disorder, single episode, severe without psychotic features: Secondary | ICD-10-CM

## 2015-09-25 MED ORDER — FLUOXETINE HCL 20 MG PO CAPS: 20.0000 mg | ORAL_CAPSULE | Freq: Every day | ORAL | Status: DC

## 2015-09-25 MED ORDER — AMPHETAMINE-DEXTROAMPHET ER 20 MG PO CP24: 20.0000 mg | ORAL_CAPSULE | Freq: Every day | ORAL | Status: DC

## 2015-09-25 NOTE — Assessment & Plan Note (Signed)
Pt's mood has stabilized and improved since starting medication.  She continues to see counselor- applauded her efforts.  Denies SI/HI.  Pt is now safely living back at home and feels physically safe.  Refills provided.  Will continue to follow.

## 2015-09-25 NOTE — Progress Notes (Signed)
Pre visit review using our clinic review tool, if applicable. No additional management support is needed unless otherwise documented below in the visit note. 

## 2015-09-25 NOTE — Patient Instructions (Signed)
Schedule your complete physical at your convenience Continue the Fluoxetine daily Take the Adderall daily for attention issues Continue to make healthy food choices and get regular exercise (it's a great stress outlet!) Call with any questions or concerns Happy Belated Birthday!!!
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
## 2015-10-01 ENCOUNTER — Telehealth: Payer: Self-pay | Admitting: Family Medicine

## 2015-10-01 ENCOUNTER — Other Ambulatory Visit: Payer: Self-pay | Admitting: General Practice

## 2015-10-01 MED ORDER — FLUOXETINE HCL 20 MG PO CAPS: 20.0000 mg | ORAL_CAPSULE | Freq: Every day | ORAL | Status: DC

## 2015-10-01 NOTE — Telephone Encounter (Signed)
Called and spoke with pt who advised that the prozac needed to go to [facility] on [address]. Pt was advised that this was not conveyed at appt. Med resent today.   Pt also advised that her Adderall is to expensive. Pt was advised to contact her insurance company to verify what is on their preferred formulary list. Pt will notify office as to preferred medication.

## 2015-10-01 NOTE — Telephone Encounter (Signed)
Caller name: Maylene RoesHunter Bergeman  Relationship to patient: Self  Can be reached: [phone] ext [phone]      (Cell) [phone]  [facility] [place] [place] - [phone]  Reason for call: Pt is requesting a refill on her Fluoxetine Rx. She is also requesting a call back because she wants to discuss a less expensive medication.   Please Advise pt.   Thanks.

## 2015-10-31 ENCOUNTER — Telehealth: Payer: Self-pay

## 2015-10-31 NOTE — Telephone Encounter (Signed)
Patient scheduled [facility] tomorrow[facility]' Saturday [facility]. Patient notified.

## 2015-10-31 NOTE — Telephone Encounter (Signed)
Called patient regarding appointment. No appointments available here today. Left message that she could [facility] if she would like. Asked patien to return my call so that we may schedule appointment.

## 2015-10-31 NOTE — Telephone Encounter (Signed)
Patient states she is currently having bad cough which is poroductive with dark yellowish - green mucus. States her appetite is poor, and has a wheezing. States she has nasal congestion and chest pain when coughing. Patient states she has a HX of Childhood asthma.  Please advise.

## 2015-10-31 NOTE — Telephone Encounter (Signed)
Pt needs to schedule appt for evaluation

## 2015-11-01 ENCOUNTER — Encounter: Payer: Self-pay | Admitting: Family Medicine

## 2015-11-01 ENCOUNTER — Ambulatory Visit (INDEPENDENT_AMBULATORY_CARE_PROVIDER_SITE_OTHER): Payer: Self-pay | Admitting: Family Medicine

## 2015-11-01 DIAGNOSIS — J4521 Mild intermittent asthma with (acute) exacerbation: Secondary | ICD-10-CM

## 2015-11-01 DIAGNOSIS — J209 Acute bronchitis, unspecified: Secondary | ICD-10-CM | POA: Insufficient documentation

## 2015-11-01 DIAGNOSIS — J45909 Unspecified asthma, uncomplicated: Secondary | ICD-10-CM | POA: Insufficient documentation

## 2015-11-01 DIAGNOSIS — J45901 Unspecified asthma with (acute) exacerbation: Secondary | ICD-10-CM | POA: Insufficient documentation

## 2015-11-01 MED ORDER — GUAIFENESIN-CODEINE 100-10 MG/5ML PO SYRP: 5.0000 mL | ORAL_SOLUTION | Freq: Every evening | ORAL | Status: DC | PRN

## 2015-11-01 MED ORDER — AZITHROMYCIN 250 MG PO TABS: ORAL_TABLET | ORAL | Status: DC

## 2015-11-01 NOTE — Assessment & Plan Note (Signed)
Mild, likely bacterial trigger. Treat with azithromycin. Cough suppresant at night and albuterol prn shortness of breath.

## 2015-11-01 NOTE — Progress Notes (Signed)
Pre visit review using our clinic review tool, if applicable. No additional management support is needed unless otherwise documented below in the visit note. 

## 2015-11-01 NOTE — Patient Instructions (Addendum)
Treat with azithromycin if not improving in next few days.  Cough suppresant at night and albuterol prn shortness of breath.  Go to ER with severe shortness of breath.

## 2015-11-01 NOTE — Progress Notes (Signed)
   Subjective:    Patient ID: Claire Rivas, female    DOB: 06/24/1991, 24 y.o.   MRN: [id number]  Cough This is a new problem. The current episode started 1 to 4 weeks ago ( 1.5 weeks ago). The problem has been gradually worsening. The problem occurs constantly. The cough is productive of sputum. Associated symptoms include chest pain, myalgias, nasal congestion, shortness of breath and wheezing. Pertinent negatives include no ear pain, fever, postnasal drip or sore throat. Associated symptoms comments: No sinus pain. The symptoms are aggravated by lying down (keeping her up at night). Risk factors: nonsmoker. She has tried a beta-agonist inhaler ( dayquil, nyquil, mucinex, inhaler) for the symptoms. The treatment provided mild relief. Her past medical history is significant for asthma. There is no history of COPD or environmental allergies. mild intermiitent asthma.Marland Kitchen. occurs when sick   Social History /Family History/Past Medical History reviewed and updated if needed.    Review of Systems  Constitutional: Negative for fever.  HENT: Negative for ear pain, postnasal drip and sore throat.   Respiratory: Positive for cough, shortness of breath and wheezing.   Cardiovascular: Positive for chest pain.  Musculoskeletal: Positive for myalgias.  Allergic/Immunologic: Negative for environmental allergies.       Objective:   Physical Exam  Constitutional: Vital signs are normal. She appears well-developed and well-nourished. She is cooperative.  Non-toxic appearance. She does not appear ill. No distress.  HENT:  Head: Normocephalic.  Right Ear: Hearing, tympanic membrane, external ear and ear canal normal. Tympanic membrane is not erythematous, not retracted and not bulging.  Left Ear: Hearing, tympanic membrane, external ear and ear canal normal. Tympanic membrane is not erythematous, not retracted and not bulging.  Nose: Mucosal edema and rhinorrhea present. Right sinus exhibits no maxillary  sinus tenderness and no frontal sinus tenderness. Left sinus exhibits no maxillary sinus tenderness and no frontal sinus tenderness.  Mouth/Throat: Uvula is midline, oropharynx is clear and moist and mucous membranes are normal.  Eyes: Conjunctivae, EOM and lids are normal. Pupils are equal, round, and reactive to light. Lids are everted and swept, no foreign bodies found.  Neck: Trachea normal and normal range of motion. Neck supple. Carotid bruit is not present. No thyroid mass and no thyromegaly present.  Cardiovascular: Normal rate, regular rhythm, S1 normal, S2 normal, normal heart sounds, intact distal pulses and normal pulses.  Exam reveals no gallop and no friction rub.   No murmur heard. Pulmonary/Chest: Effort normal. No tachypnea. No respiratory distress. She has no decreased breath sounds. She has wheezes. She has no rhonchi. She has no rales.   Scattered wheese  Neurological: She is alert.  Skin: Skin is warm, dry and intact. No rash noted.  Psychiatric: Her speech is normal and behavior is normal. Judgment normal. Her mood appears not anxious. Cognition and memory are normal. She does not exhibit a depressed mood.          Assessment & Plan:

## 2016-01-06 ENCOUNTER — Telehealth: Payer: Self-pay | Admitting: Family Medicine

## 2016-01-06 ENCOUNTER — Other Ambulatory Visit: Payer: Self-pay

## 2016-01-06 MED ORDER — AMPHETAMINE-DEXTROAMPHET ER 20 MG PO CP24: 20.0000 mg | ORAL_CAPSULE | Freq: Every day | ORAL | Status: DC

## 2016-01-06 NOTE — Telephone Encounter (Signed)
Relation to ZO:XWRU Call back [phone]   Reason for call:  Patient requesting a refill amphetamine-dextroamphetamine (ADDERALL XR) 20 MG 24 hr capsule

## 2016-01-06 NOTE — Telephone Encounter (Signed)
RX printed for signature pending approval.

## 2016-01-08 ENCOUNTER — Encounter: Payer: Self-pay | Admitting: Family Medicine

## 2016-01-08 NOTE — Progress Notes (Signed)
Left message on patient answering machine regarding Adderall Rx being at front desk for pick up.

## 2016-02-06 ENCOUNTER — Other Ambulatory Visit: Payer: Self-pay | Admitting: Family Medicine

## 2016-02-06 MED ORDER — ADDERALL XR 20 MG PO CP24: 20.0000 mg | ORAL_CAPSULE | Freq: Every day | ORAL | Status: DC

## 2016-02-06 NOTE — Telephone Encounter (Signed)
Caller name: Self Relationship to patient: Can be reached: [phone]   Reason for call: Request refill on ADDERALL XR) 20 MG 24 hr capsule [[id number]] request brand name because insurance will no longer pay for genericAADDERALL XR) 20 MG 24 hr capsule [[id number]]DDERALL XR) 20 MG 24 hr capsule [[id number]]

## 2016-02-06 NOTE — Telephone Encounter (Signed)
Last OV 09/25/15, no upcoming appts has never had a CPE  adderall last filled 01/06/16 #30 with 0  Written for name brand at pt request.

## 2016-02-06 NOTE — Telephone Encounter (Signed)
Med filled and pt informed available at front desk for pick up.  

## 2016-03-11 ENCOUNTER — Encounter: Payer: Self-pay | Admitting: General Practice

## 2016-03-11 ENCOUNTER — Telehealth: Payer: Self-pay | Admitting: Family Medicine

## 2016-03-11 LAB — HM PAP SMEAR

## 2016-03-11 MED ORDER — ADDERALL XR 20 MG PO CP24: 20.0000 mg | ORAL_CAPSULE | Freq: Every day | ORAL | Status: DC

## 2016-03-11 NOTE — Telephone Encounter (Signed)
Last OV 10/05/15 adderall last filled 02/06/16 #30 with 0  Please advise pt wants to pick up in [place]

## 2016-03-11 NOTE — Telephone Encounter (Signed)
Ok for #30, we can interoffice this to [place]

## 2016-03-11 NOTE — Telephone Encounter (Signed)
Relation to VH:QIONpt:self Call back number: work # [phone] Pharmacy:  Reason for call:  Patient requesting a refill ADDERALL XR 20 MG 24 hr capsule and would like to pick up if possible at the [place] location

## 2016-03-11 NOTE — Telephone Encounter (Signed)
medfilled and sent for interoffice. UDS needed.

## 2016-03-17 ENCOUNTER — Encounter: Payer: Self-pay | Admitting: General Practice

## 2016-04-19 ENCOUNTER — Other Ambulatory Visit: Payer: Self-pay | Admitting: Family Medicine

## 2016-04-19 ENCOUNTER — Encounter: Payer: Self-pay | Admitting: General Practice

## 2016-04-19 MED ORDER — ADDERALL XR 20 MG PO CP24: 20.0000 mg | ORAL_CAPSULE | Freq: Every day | ORAL | Status: DC

## 2016-04-19 NOTE — Telephone Encounter (Signed)
Last OV 09/25/15 adderall last filled 03/11/16 #30 with 0

## 2016-04-19 NOTE — Telephone Encounter (Signed)
Med filled and pt advised available at the front desk for pick up.  

## 2016-04-19 NOTE — Telephone Encounter (Signed)
Pt needs refill on adderall °

## 2016-05-24 ENCOUNTER — Other Ambulatory Visit: Payer: Self-pay | Admitting: Family Medicine

## 2016-05-24 MED ORDER — ADDERALL XR 20 MG PO CP24: 20.0000 mg | ORAL_CAPSULE | Freq: Every day | ORAL | Status: DC

## 2016-05-24 NOTE — Telephone Encounter (Signed)
Med filled and pt notified available for pick up at [facility] location.

## 2016-05-24 NOTE — Telephone Encounter (Signed)
Last OV 09/25/15, NO CPE on file adderall last filled 04/19/16 #30 with 0

## 2016-05-24 NOTE — Telephone Encounter (Signed)
Pt needs refill on adderall °

## 2016-05-24 NOTE — Telephone Encounter (Signed)
Ok for refill- pt needs appt

## 2016-05-31 ENCOUNTER — Other Ambulatory Visit: Payer: Self-pay | Admitting: Family Medicine

## 2016-06-01 NOTE — Telephone Encounter (Signed)
Medication filled to pharmacy as requested.   

## 2016-07-02 ENCOUNTER — Encounter: Payer: Self-pay | Admitting: General Practice

## 2016-07-02 ENCOUNTER — Other Ambulatory Visit: Payer: Self-pay | Admitting: Family Medicine

## 2016-07-02 MED ORDER — ADDERALL XR 20 MG PO CP24: 20.0000 mg | ORAL_CAPSULE | Freq: Every day | ORAL | Status: DC

## 2016-07-02 NOTE — Telephone Encounter (Signed)
Pt informed on my chart that rx is available at front desk of [place] location for pick up. Could not reach by phone as message said "caller is not accepting calls at this time"

## 2016-07-02 NOTE — Telephone Encounter (Signed)
Pt needs refill on adderall °

## 2016-07-02 NOTE — Telephone Encounter (Signed)
Last OV 09/25/15 (CPE 07-30-16 scheduled) adderall last filled 05/24/16 #30 with 0

## 2016-07-30 ENCOUNTER — Ambulatory Visit (INDEPENDENT_AMBULATORY_CARE_PROVIDER_SITE_OTHER): Payer: Self-pay | Admitting: Family Medicine

## 2016-07-30 ENCOUNTER — Encounter: Payer: Self-pay | Admitting: Family Medicine

## 2016-07-30 VITALS — BP 115/82 | HR 87 | Temp 98.1°F | Resp 16 | Ht 64.0 in | Wt 164.1 lb

## 2016-07-30 DIAGNOSIS — Z0000 Encounter for general adult medical examination without abnormal findings: Secondary | ICD-10-CM

## 2016-07-30 MED ORDER — ADDERALL XR 20 MG PO CP24: 20.0000 mg | ORAL_CAPSULE | Freq: Every day | ORAL | 0 refills | Status: DC

## 2016-07-30 NOTE — Patient Instructions (Signed)
Follow up in 6 months to recheck ADHD and mood Continue to work on healthy diet and regular exercise- you look great! I'm so proud of what you've overcome!!  You should be, too! Call with any questions or concerns Enjoy the rest of your summer!!!

## 2016-07-30 NOTE — Progress Notes (Signed)
Pre visit review using our clinic review tool, if applicable. No additional management support is needed unless otherwise documented below in the visit note. 
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
## 2016-09-10 ENCOUNTER — Other Ambulatory Visit: Payer: Self-pay | Admitting: Family Medicine

## 2016-09-10 NOTE — Telephone Encounter (Signed)
Pt needs a refill om adderall, pt is aware that KT is out of the office today and that would be addressed on Monday. Pt is fine with that.

## 2016-09-13 MED ORDER — ADDERALL XR 20 MG PO CP24: 20.0000 mg | ORAL_CAPSULE | Freq: Every day | ORAL | 0 refills | Status: DC

## 2016-09-13 NOTE — Telephone Encounter (Signed)
Last OV 07/30/16 adderall last filled 07/30/16 #30 with 0

## 2016-09-13 NOTE — Telephone Encounter (Signed)
Med filled and pt informed available at the front desk for pick up.  

## 2016-10-15 ENCOUNTER — Other Ambulatory Visit: Payer: Self-pay | Admitting: Family Medicine

## 2016-10-15 MED ORDER — FLUOXETINE HCL 20 MG PO CAPS: 20.0000 mg | ORAL_CAPSULE | Freq: Every day | ORAL | 3 refills | Status: DC

## 2016-10-15 MED ORDER — ADDERALL XR 20 MG PO CP24: 20.0000 mg | ORAL_CAPSULE | Freq: Every day | ORAL | 0 refills | Status: DC

## 2016-10-15 NOTE — Telephone Encounter (Signed)
Pt needs refill on adderall and prozac. Pt has called the pharmacy for the prozac

## 2016-10-15 NOTE — Telephone Encounter (Signed)
Last OV 07/30/16 adderall last filled 09/13/16 #30 with 0  prozac filled today.

## 2016-10-15 NOTE — Telephone Encounter (Signed)
Called pt and LMOVM to inform that Rx is available fro pick up at the front desk .

## 2016-12-02 ENCOUNTER — Telehealth: Payer: Self-pay | Admitting: Family Medicine

## 2016-12-02 MED ORDER — ADDERALL XR 20 MG PO CP24: 20.0000 mg | ORAL_CAPSULE | Freq: Every day | ORAL | 0 refills | Status: DC

## 2016-12-02 NOTE — Telephone Encounter (Signed)
Last OV 07/30/16 adderall last filled 10/15/16 #30 with 0  Per PCP, med filled and pt made aware available at front desk for pick up.

## 2017-01-18 ENCOUNTER — Other Ambulatory Visit: Payer: Self-pay | Admitting: Family Medicine

## 2017-01-18 NOTE — Telephone Encounter (Signed)
Last OV 07/30/16 adderall last filled 12/02/16 #30 with 0

## 2017-01-18 NOTE — Telephone Encounter (Signed)
Pt asking for refill on adderall

## 2017-01-19 MED ORDER — ADDERALL XR 20 MG PO CP24: 20.0000 mg | ORAL_CAPSULE | Freq: Every day | ORAL | 0 refills | Status: DC

## 2017-02-02 ENCOUNTER — Encounter: Payer: Self-pay | Admitting: *Deleted

## 2017-02-16 ENCOUNTER — Other Ambulatory Visit: Payer: Self-pay | Admitting: Family Medicine

## 2017-02-16 MED ORDER — ADDERALL XR 20 MG PO CP24: 20.0000 mg | ORAL_CAPSULE | Freq: Every day | ORAL | 0 refills | Status: DC

## 2017-02-16 NOTE — Telephone Encounter (Signed)
Last OV 07/30/16 adderall last filled 01/19/17 #30 with 0

## 2017-02-16 NOTE — Telephone Encounter (Signed)
Patient requesting refill of ADDERALL XR 20 MG 24 hr capsule.

## 2017-02-17 NOTE — Telephone Encounter (Signed)
Pt notified rx is available at front desk for pick up.

## 2017-02-25 ENCOUNTER — Other Ambulatory Visit: Payer: Self-pay | Admitting: Family Medicine

## 2018-05-03 ENCOUNTER — Encounter: Payer: Self-pay | Admitting: General Practice

## 2018-12-21 DIAGNOSIS — R87610 Atypical squamous cells of undetermined significance on cytologic smear of cervix (ASC-US): Secondary | ICD-10-CM | POA: Insufficient documentation

## 2019-04-13 DIAGNOSIS — Z9189 Other specified personal risk factors, not elsewhere classified: Secondary | ICD-10-CM | POA: Insufficient documentation

## 2019-08-02 DIAGNOSIS — B009 Herpesviral infection, unspecified: Secondary | ICD-10-CM | POA: Insufficient documentation

## 2019-08-22 DIAGNOSIS — Z975 Presence of (intrauterine) contraceptive device: Secondary | ICD-10-CM | POA: Insufficient documentation

## 2019-10-01 DIAGNOSIS — Z91013 Allergy to seafood: Secondary | ICD-10-CM | POA: Insufficient documentation

## 2019-11-07 DIAGNOSIS — O99345 Other mental disorders complicating the puerperium: Secondary | ICD-10-CM | POA: Insufficient documentation

## 2019-11-07 DIAGNOSIS — F53 Postpartum depression: Secondary | ICD-10-CM | POA: Insufficient documentation

## 2020-08-13 DIAGNOSIS — U071 COVID-19: Secondary | ICD-10-CM | POA: Insufficient documentation

## 2021-11-26 ENCOUNTER — Other Ambulatory Visit: Payer: Self-pay | Admitting: Otolaryngology

## 2021-11-26 DIAGNOSIS — H9042 Sensorineural hearing loss, unilateral, left ear, with unrestricted hearing on the contralateral side: Secondary | ICD-10-CM

## 2021-12-11 ENCOUNTER — Ambulatory Visit
Admission: RE | Admit: 2021-12-11 | Discharge: 2021-12-11 | Disposition: A | Discharge status: Home / Self Care | Payer: Medicaid Other | Source: Ambulatory Visit | Attending: Otolaryngology | Admitting: Otolaryngology

## 2021-12-11 ENCOUNTER — Other Ambulatory Visit: Payer: Self-pay

## 2021-12-11 DIAGNOSIS — H9042 Sensorineural hearing loss, unilateral, left ear, with unrestricted hearing on the contralateral side: Secondary | ICD-10-CM | POA: Diagnosis present

## 2021-12-11 MED ORDER — GADOBUTROL 1 MMOL/ML IV SOLN
7.5000 mL | Freq: Once | INTRAVENOUS | Status: AC | PRN
  Administered 2021-12-11: 17:00:00 7.5 mL via INTRAVENOUS

## 2022-01-07 ENCOUNTER — Other Ambulatory Visit
Admission: RE | Admit: 2022-01-07 | Discharge: 2022-01-07 | Disposition: A | Discharge status: Home / Self Care | Payer: Medicaid Other | Source: Ambulatory Visit | Attending: Neurology | Admitting: Neurology

## 2022-01-07 DIAGNOSIS — G379 Demyelinating disease of central nervous system, unspecified: Secondary | ICD-10-CM | POA: Diagnosis present

## 2022-01-07 LAB — PROTIME-INR
INR: 1 (ref 0.8–1.2)
Prothrombin Time: 12.7 seconds (ref 11.4–15.2)

## 2022-01-07 LAB — APTT: aPTT: 32 seconds (ref 24–36)

## 2022-01-11 ENCOUNTER — Other Ambulatory Visit: Payer: Self-pay | Admitting: Neurology

## 2022-01-11 DIAGNOSIS — G379 Demyelinating disease of central nervous system, unspecified: Secondary | ICD-10-CM

## 2022-01-20 ENCOUNTER — Ambulatory Visit: Payer: Medicaid Other

## 2022-01-22 ENCOUNTER — Other Ambulatory Visit: Payer: Self-pay

## 2022-01-22 ENCOUNTER — Ambulatory Visit
Admission: RE | Admit: 2022-01-22 | Discharge: 2022-01-22 | Disposition: A | Discharge status: Home / Self Care | Payer: Medicaid Other | Source: Ambulatory Visit | Attending: Neurology | Admitting: Neurology

## 2022-01-22 DIAGNOSIS — G379 Demyelinating disease of central nervous system, unspecified: Secondary | ICD-10-CM

## 2022-01-22 MED ORDER — GADOBUTROL 1 MMOL/ML IV SOLN
7.5000 mL | Freq: Once | INTRAVENOUS | Status: AC | PRN
  Administered 2022-01-22: 7.5 mL via INTRAVENOUS

## 2022-01-28 ENCOUNTER — Emergency Department: Payer: Medicaid Other

## 2022-01-28 ENCOUNTER — Other Ambulatory Visit: Payer: Self-pay

## 2022-01-28 ENCOUNTER — Emergency Department
Admission: EM | Admit: 2022-01-28 | Discharge: 2022-01-29 | Disposition: A | Discharge status: Home / Self Care | Payer: Medicaid Other | Attending: Emergency Medicine | Admitting: Emergency Medicine

## 2022-01-28 ENCOUNTER — Ambulatory Visit
Admission: RE | Admit: 2022-01-28 | Discharge: 2022-01-28 | Disposition: A | Discharge status: Home / Self Care | Payer: Medicaid Other | Source: Ambulatory Visit | Attending: Neurology | Admitting: Neurology

## 2022-01-28 DIAGNOSIS — E876 Hypokalemia: Secondary | ICD-10-CM | POA: Diagnosis not present

## 2022-01-28 DIAGNOSIS — G971 Other reaction to spinal and lumbar puncture: Secondary | ICD-10-CM | POA: Insufficient documentation

## 2022-01-28 DIAGNOSIS — G379 Demyelinating disease of central nervous system, unspecified: Secondary | ICD-10-CM

## 2022-01-28 DIAGNOSIS — R519 Headache, unspecified: Secondary | ICD-10-CM | POA: Diagnosis present

## 2022-01-28 LAB — GLUCOSE, CSF: Glucose, CSF: 52 mg/dL (ref 40–70)

## 2022-01-28 LAB — COMPREHENSIVE METABOLIC PANEL
ALT: 26 U/L (ref 0–44)
AST: 16 U/L (ref 15–41)
Albumin: 4 g/dL (ref 3.5–5.0)
Alkaline Phosphatase: 65 U/L (ref 38–126)
Anion gap: 7 (ref 5–15)
BUN: 12 mg/dL (ref 6–20)
CO2: 22 mmol/L (ref 22–32)
Calcium: 8.7 mg/dL — ABNORMAL LOW (ref 8.9–10.3)
Chloride: 107 mmol/L (ref 98–111)
Creatinine, Ser: 0.52 mg/dL (ref 0.44–1.00)
GFR, Estimated: >60 mL/min (ref >60)
Glucose, Bld: 94 mg/dL (ref 70–99)
Potassium: 3.4 mmol/L — ABNORMAL LOW (ref 3.5–5.1)
Sodium: 136 mmol/L (ref 135–145)
Total Bilirubin: 0.8 mg/dL (ref 0.3–1.2)
Total Protein: 6.5 g/dL (ref 6.5–8.1)

## 2022-01-28 LAB — CBC WITH DIFFERENTIAL/PLATELET
Abs Immature Granulocytes: 0.04 10*3/uL (ref 0.00–0.07)
Basophils Absolute: 0 10*3/uL (ref 0.0–0.1)
Basophils Relative: 0 %
Eosinophils Absolute: 0 10*3/uL (ref 0.0–0.5)
Eosinophils Relative: 0 %
HCT: 40.4 % (ref 36.0–46.0)
Hemoglobin: 13.9 g/dL (ref 12.0–15.0)
Immature Granulocytes: 0 %
Lymphocytes Relative: 7 %
Lymphs Abs: 0.7 10*3/uL (ref 0.7–4.0)
MCH: 29.8 pg (ref 26.0–34.0)
MCHC: 34.4 g/dL (ref 30.0–36.0)
MCV: 86.5 fL (ref 80.0–100.0)
Monocytes Absolute: 0.5 10*3/uL (ref 0.1–1.0)
Monocytes Relative: 6 %
Neutro Abs: 8.1 10*3/uL — ABNORMAL HIGH (ref 1.7–7.7)
Neutrophils Relative %: 87 %
Platelets: 189 10*3/uL (ref 150–400)
RBC: 4.67 MIL/uL (ref 3.87–5.11)
RDW: 11.9 % (ref 11.5–15.5)
WBC: 9.3 10*3/uL (ref 4.0–10.5)
nRBC: 0 % (ref 0.0–0.2)

## 2022-01-28 LAB — CSF CELL COUNT WITH DIFFERENTIAL
Eosinophils, CSF: 1 %
Lymphs, CSF: 35 %
Monocyte-Macrophage-Spinal Fluid: 10 %
RBC Count, CSF: 1644 /mm3 — ABNORMAL HIGH (ref 0–3)
Segmented Neutrophils-CSF: 54 %
Tube #: 3
WBC, CSF: 10 /mm3 — ABNORMAL HIGH (ref 0–5)

## 2022-01-28 LAB — PROTEIN, CSF: Total  Protein, CSF: 53 mg/dL — ABNORMAL HIGH (ref 15–45)

## 2022-01-28 MED ORDER — PROMETHAZINE HCL 25 MG/ML IJ SOLN
12.5000 mg | Freq: Once | INTRAMUSCULAR | Status: AC | PRN
Start: 2022-01-28 — End: 2022-01-29
  Administered 2022-01-29: 12.5 mg via INTRAVENOUS
  Filled 2022-01-28 (×2): qty 0.5
  Filled 2022-01-28: qty 12.5

## 2022-01-28 MED ORDER — SODIUM CHLORIDE 0.9 % IV BOLUS (SEPSIS)
1000.0000 mL | Freq: Once | INTRAVENOUS | Status: AC
Start: 2022-01-28 — End: 2022-01-29
  Administered 2022-01-29: 1000 mL via INTRAVENOUS

## 2022-01-28 MED ORDER — DIPHENHYDRAMINE HCL 50 MG/ML IJ SOLN
25.0000 mg | Freq: Once | INTRAMUSCULAR | Status: AC
  Administered 2022-01-28: 25 mg via INTRAVENOUS
  Filled 2022-01-28: qty 1

## 2022-01-28 MED ORDER — METOCLOPRAMIDE HCL 5 MG/ML IJ SOLN
10.0000 mg | Freq: Once | INTRAMUSCULAR | Status: AC
  Administered 2022-01-28: 10 mg via INTRAVENOUS
  Filled 2022-01-28: qty 2

## 2022-01-28 MED ORDER — SODIUM CHLORIDE 0.9 % IV BOLUS
1000.0000 mL | Freq: Once | INTRAVENOUS | Status: AC
  Administered 2022-01-28: 1000 mL via INTRAVENOUS

## 2022-01-28 MED ORDER — POTASSIUM CHLORIDE CRYS ER 20 MEQ PO TBCR
40.0000 meq | EXTENDED_RELEASE_TABLET | Freq: Once | ORAL | Status: AC
  Administered 2022-01-29: 40 meq via ORAL
  Filled 2022-01-28: qty 2

## 2022-01-28 NOTE — ED Provider Notes (Addendum)
11:20 PM  Assumed care at signout.  Patient here with complaints of headache, lower back pain and an episode of bowel incontinence after her lumbar puncture today to evaluate for MS.  MRI of the lumbar spine pending.  Patient getting migraine cocktail, IV fluids.  IR has been consulted and recommended symptomatic treatment and a period of lying flat for 24 hours before blood patch would be performed.  2:30 AM  Pt's images reviewed by myself and radiologist.  MRI shows a central small disc protrusion at L4-L5 which is a level lower than where her LP was performed.  Notes from interventional radiology from yesterday were reviewed and it appears her lumbar puncture was performed at L3-L4 and L2-L3.  She reports her headache is now 2/10 but is complaining of back pain whenever she moves around.  Will give fentanyl.  Still having some nausea.  Will give Phenergan.  She is tolerating p.o. currently.  Urine pending.  Will ambulate but hopefully be able to discharge home if she is feeling better.  3:30 AM  Pt reports feeling better.  She has been able to ambulate here.  She feels comfortable with plan for discharge home.  Recommended that she try to lie flat as much as possible for the next 24 hours and increase her fluid intake at home.  Will discharge with prescription of Reglan.  Recommended alternating Tylenol, Motrin over-the-counter for pain control.  Discussed return precautions with patient and family.  They verbalized understanding and are comfortable with this plan.   Also confirmed with patient's nurse that her urine pregnancy test was negative.  Result is not crossing over in epic.  Urine shows no sign of infection.  At this time, I do not feel there is any life-threatening condition present. I reviewed all nursing notes, vitals, pertinent previous records.  All lab and urine results, EKGs, imaging ordered have been independently reviewed and interpreted by myself.  I reviewed all available radiology  reports from any imaging ordered this visit.  Based on my assessment, I feel the patient is safe to be discharged home without further emergent workup and can continue workup as an outpatient as needed. Discussed all findings, treatment plan as well as usual and customary return precautions with patient and family at bedside.  They verbalize understanding and are comfortable with this plan.  Outpatient follow-up has been provided as needed.  All questions have been answered.    Emberly Tomasso, Delice Bison, DO 01/29/22 0355    Avaeh Ewer, Delice Bison, DO 01/29/22 [id number]

## 2022-01-28 NOTE — ED Provider Notes (Signed)
[facility] Provider Note    Event Date/Time   First MD Initiated Contact with Patient 01/28/22 2123     (approximate)   History   Headache   HPI  Claire Rivas is a 31 y.o. female who comes in with generalized headache following a lumbar puncture today.  Patient reports Patient is followed by [facility] clinic Dr. Sherryll Burger and underwent lumbar puncture today.  Patient reports that she has not been able to lie flat due to tried to take care of her 11-year-old and that she has been having a lot of headaches, nausea, vomiting and then had an episode of stool incontinence.  She denies ever having an LP before.  They are working her up for multiple sclerosis.    Physical Exam   Triage Vital Signs: ED Triage Vitals [01/28/22 2119]  Enc Vitals Group     BP      Pulse      Resp      Temp      Temp src      SpO2      Weight 192 lb (87.1 kg)     Height 5\' 5"  (1.651 m)     Head Circumference      Peak Flow      Pain Score 8     Pain Loc      Pain Edu?      Excl. in GC?     Most recent vital signs: Vitals:   01/28/22 2120  BP: 120/72  Pulse: 95  Resp: 20  SpO2: 98%     General: Awake, no distress.  CV:  Good peripheral perfusion.  Resp:  Normal effort.  Abd:  No distention. Soft non tender  Other:  Normal rectal tone.  2 marks were patient had prior LP.  She reports that first mark they did not get it at the second mark is lower where they did get the fluid out of.  She had good strength in her legs and arms.  Worsening headache with sitting up.   ED Results / Procedures / Treatments   Labs (all labs ordered are listed, but only abnormal results are displayed) Labs Reviewed  CBC WITH DIFFERENTIAL/PLATELET  URINALYSIS, ROUTINE W REFLEX MICROSCOPIC  COMPREHENSIVE METABOLIC PANEL  POC URINE PREG, ED     RADIOLOGY MRI pending  PROCEDURES:  Critical Care performed: No  Procedures   MEDICATIONS ORDERED IN ED: Medications - No data to  display   IMPRESSION / MDM / ASSESSMENT AND PLAN / ED COURSE  I reviewed the triage vital signs and the nursing notes.                              Differential diagnosis includes, but is not limited to, post LP headache but given the concern for stool incontinence  We will get MRI to ensure no evidence of cord compression.  I discussed the case with IR radiologist Dr. 2121.  He stated that typically they will not do a blood patch unless symptoms have been going on for after 24 hours of laying flat.  He also agrees with MRI without contrast to evaluate the area.  We will check some labs to make sure no Electra abnormalities, AKI.  CMP shows slightly low potassium we will give some oral repletion CBC is normal  We will give patient some fluids and migraine cocktail.  I reviewed her CSF had 10 WBCs  so no evidence of meningitis  Patient handed off to oncoming team pending MRI, urine pregnant and repeat evaluation of symptoms   FINAL CLINICAL IMPRESSION(S) / ED DIAGNOSES   Final diagnoses:  Lumbar puncture headache     Rx / DC Orders   ED Discharge Orders     None        Note:  This document was prepared using Dragon voice recognition software and may include unintentional dictation errors.   Concha Se, MD 01/28/22 2322

## 2022-01-28 NOTE — Progress Notes (Signed)
Patient remains clinically stable, denies complaints during time here in recovery/post procedure. Vitals stable, discharge instructions given.

## 2022-01-28 NOTE — ED Notes (Signed)
Pt got 2 LP today at the doctor office and since then pt has been having a migraine and nausea and vomiting with diarrhea today.   Pt is A&Ox4. Pt can move both lower and upper extremities Pt is still c/o of nausea at this time and is still having back pain.

## 2022-01-28 NOTE — ED Triage Notes (Signed)
Pt presents via POV with complaints of a generalized headache following a lumbar puncture today that had two attempts. She states that since the LP she has had several episodes of vomiting and has lost control of her bowels today. LP site is clean and intact. Denies CP or SOB.

## 2022-01-29 LAB — POC URINE PREG, ED: Preg Test, Ur: NEGATIVE

## 2022-01-29 LAB — URINALYSIS, ROUTINE W REFLEX MICROSCOPIC
Bilirubin Urine: NEGATIVE
Glucose, UA: NEGATIVE mg/dL
Hgb urine dipstick: NEGATIVE
Ketones, ur: 20 mg/dL — AB
Leukocytes,Ua: NEGATIVE
Nitrite: NEGATIVE
Protein, ur: NEGATIVE mg/dL
Specific Gravity, Urine: 1.023 (ref 1.005–1.030)
pH: 6 (ref 5.0–8.0)

## 2022-01-29 MED ORDER — METOCLOPRAMIDE HCL 10 MG PO TABS: 10.0000 mg | ORAL_TABLET | Freq: Three times a day (TID) | ORAL | 0 refills | Status: DC | PRN

## 2022-01-29 MED ORDER — ONDANSETRON 4 MG PO TBDP
4.0000 mg | ORAL_TABLET | Freq: Four times a day (QID) | ORAL | 0 refills | Status: DC | PRN
Start: 2022-01-29 — End: 2022-02-02

## 2022-01-29 MED ORDER — FENTANYL CITRATE PF 50 MCG/ML IJ SOSY
50.0000 ug | PREFILLED_SYRINGE | Freq: Once | INTRAMUSCULAR | Status: AC
  Administered 2022-01-29: 50 ug via INTRAVENOUS
  Filled 2022-01-29: qty 1

## 2022-01-29 NOTE — Discharge Instructions (Signed)
You may alternate Tylenol 1000 mg every 6 hours as needed for pain, fever and Ibuprofen 800 mg every 6-8 hours as needed for pain, fever.  Please take Ibuprofen with food.  Do not take more than 4000 mg of Tylenol (acetaminophen) in a 24 hour period.  We recommend that you lie flat for the next 24 hours.  Recommend increase fluid intake over the next couple of days.  If your headache has not improved with these interventions at home, you may return to the emergency department in the daytime and we will contact interventional radiology to perform a blood patch.

## 2022-01-29 NOTE — ED Notes (Signed)
Pt was given graham cracker and water. Pt stated after eating she felt nauseous.  RN also ambulated pt in room. Pt ambulated with steady gait with no c/o of dizziness but felt pressure in her back where LP was done.

## 2022-01-31 ENCOUNTER — Other Ambulatory Visit: Payer: Self-pay

## 2022-01-31 ENCOUNTER — Observation Stay
Admission: EM | Admit: 2022-01-31 | Discharge: 2022-02-02 | Disposition: A | Discharge status: Home / Self Care | Payer: Medicaid Other | Attending: Internal Medicine | Admitting: Internal Medicine

## 2022-01-31 ENCOUNTER — Emergency Department: Payer: Medicaid Other

## 2022-01-31 ENCOUNTER — Encounter: Payer: Self-pay | Admitting: Emergency Medicine

## 2022-01-31 DIAGNOSIS — K219 Gastro-esophageal reflux disease without esophagitis: Secondary | ICD-10-CM | POA: Diagnosis present

## 2022-01-31 DIAGNOSIS — J341 Cyst and mucocele of nose and nasal sinus: Secondary | ICD-10-CM | POA: Diagnosis present

## 2022-01-31 DIAGNOSIS — Z79899 Other long term (current) drug therapy: Secondary | ICD-10-CM

## 2022-01-31 DIAGNOSIS — Y844 Aspiration of fluid as the cause of abnormal reaction of the patient, or of later complication, without mention of misadventure at the time of the procedure: Secondary | ICD-10-CM | POA: Diagnosis present

## 2022-01-31 DIAGNOSIS — Z9109 Other allergy status, other than to drugs and biological substances: Secondary | ICD-10-CM | POA: Diagnosis present

## 2022-01-31 DIAGNOSIS — F419 Anxiety disorder, unspecified: Secondary | ICD-10-CM | POA: Diagnosis present

## 2022-01-31 DIAGNOSIS — Z8249 Family history of ischemic heart disease and other diseases of the circulatory system: Secondary | ICD-10-CM

## 2022-01-31 DIAGNOSIS — Z882 Allergy status to sulfonamides status: Secondary | ICD-10-CM

## 2022-01-31 DIAGNOSIS — E669 Obesity, unspecified: Secondary | ICD-10-CM | POA: Diagnosis present

## 2022-01-31 DIAGNOSIS — F329 Major depressive disorder, single episode, unspecified: Secondary | ICD-10-CM | POA: Diagnosis not present

## 2022-01-31 DIAGNOSIS — Z91048 Other nonmedicinal substance allergy status: Secondary | ICD-10-CM

## 2022-01-31 DIAGNOSIS — G971 Other reaction to spinal and lumbar puncture: Principal | ICD-10-CM | POA: Insufficient documentation

## 2022-01-31 DIAGNOSIS — Z6831 Body mass index (BMI) 31.0-31.9, adult: Secondary | ICD-10-CM

## 2022-01-31 DIAGNOSIS — Z8349 Family history of other endocrine, nutritional and metabolic diseases: Secondary | ICD-10-CM

## 2022-01-31 DIAGNOSIS — F322 Major depressive disorder, single episode, severe without psychotic features: Secondary | ICD-10-CM | POA: Diagnosis present

## 2022-01-31 DIAGNOSIS — Z20822 Contact with and (suspected) exposure to covid-19: Secondary | ICD-10-CM | POA: Insufficient documentation

## 2022-01-31 DIAGNOSIS — R519 Headache, unspecified: Secondary | ICD-10-CM | POA: Diagnosis present

## 2022-01-31 DIAGNOSIS — Z91040 Latex allergy status: Secondary | ICD-10-CM

## 2022-01-31 DIAGNOSIS — Z8261 Family history of arthritis: Secondary | ICD-10-CM

## 2022-01-31 DIAGNOSIS — Z833 Family history of diabetes mellitus: Secondary | ICD-10-CM

## 2022-01-31 DIAGNOSIS — Z818 Family history of other mental and behavioral disorders: Secondary | ICD-10-CM

## 2022-01-31 DIAGNOSIS — H9192 Unspecified hearing loss, left ear: Secondary | ICD-10-CM | POA: Diagnosis present

## 2022-01-31 LAB — BASIC METABOLIC PANEL
Anion gap: 9 (ref 5–15)
BUN: 11 mg/dL (ref 6–20)
CO2: 23 mmol/L (ref 22–32)
Calcium: 8.9 mg/dL (ref 8.9–10.3)
Chloride: 106 mmol/L (ref 98–111)
Creatinine, Ser: 0.63 mg/dL (ref 0.44–1.00)
GFR, Estimated: >60 mL/min (ref >60)
Glucose, Bld: 113 mg/dL — ABNORMAL HIGH (ref 70–99)
Potassium: 3.3 mmol/L — ABNORMAL LOW (ref 3.5–5.1)
Sodium: 138 mmol/L (ref 135–145)

## 2022-01-31 LAB — CBC
HCT: 41.5 % (ref 36.0–46.0)
Hemoglobin: 14.1 g/dL (ref 12.0–15.0)
MCH: 29.1 pg (ref 26.0–34.0)
MCHC: 34 g/dL (ref 30.0–36.0)
MCV: 85.6 fL (ref 80.0–100.0)
Platelets: 238 10*3/uL (ref 150–400)
RBC: 4.85 MIL/uL (ref 3.87–5.11)
RDW: 11.7 % (ref 11.5–15.5)
WBC: 5.9 10*3/uL (ref 4.0–10.5)
nRBC: 0 % (ref 0.0–0.2)

## 2022-01-31 LAB — TSH: TSH: 1.6 u[IU]/mL (ref 0.350–4.500)

## 2022-01-31 LAB — RESP PANEL BY RT-PCR (FLU A&B, COVID) ARPGX2
Influenza A by PCR: NEGATIVE
Influenza B by PCR: NEGATIVE
SARS Coronavirus 2 by RT PCR: NEGATIVE

## 2022-01-31 LAB — T4, FREE: Free T4: 0.81 ng/dL (ref 0.61–1.12)

## 2022-01-31 MED ORDER — DEXAMETHASONE SODIUM PHOSPHATE 10 MG/ML IJ SOLN: 6.0000 mg | Freq: Once | INTRAMUSCULAR | Status: DC

## 2022-01-31 MED ORDER — SODIUM CHLORIDE 0.9 % IV BOLUS
1000.0000 mL | Freq: Once | INTRAVENOUS | Status: AC
  Administered 2022-01-31: 1000 mL via INTRAVENOUS

## 2022-01-31 MED ORDER — DIPHENHYDRAMINE HCL 50 MG/ML IJ SOLN
25.0000 mg | Freq: Once | INTRAMUSCULAR | Status: AC
  Administered 2022-01-31: 25 mg via INTRAVENOUS
  Filled 2022-01-31: qty 1

## 2022-01-31 MED ORDER — DEXAMETHASONE SODIUM PHOSPHATE 10 MG/ML IJ SOLN
10.0000 mg | Freq: Once | INTRAMUSCULAR | Status: AC
  Administered 2022-01-31: 10 mg via INTRAVENOUS
  Filled 2022-01-31: qty 1

## 2022-01-31 MED ORDER — FLUTICASONE PROPIONATE 50 MCG/ACT NA SUSP
2.0000 | Freq: Every day | NASAL | Status: AC
Start: 2022-01-31 — End: 2022-02-01
  Administered 2022-01-31 – 2022-02-01 (×2): 2 via NASAL
  Filled 2022-01-31: qty 16

## 2022-01-31 MED ORDER — PROMETHAZINE HCL 6.25 MG/5ML PO SYRP
12.5000 mg | ORAL_SOLUTION | Freq: Three times a day (TID) | ORAL | Status: DC | PRN
  Administered 2022-01-31 – 2022-02-01 (×3): 12.5 mg via ORAL
  Filled 2022-01-31 (×5): qty 10

## 2022-01-31 MED ORDER — METRONIDAZOLE 500 MG PO TABS
500.0000 mg | ORAL_TABLET | Freq: Two times a day (BID) | ORAL | Status: DC
  Administered 2022-01-31 – 2022-02-01 (×2): 500 mg via ORAL
  Filled 2022-01-31 (×2): qty 1

## 2022-01-31 MED ORDER — ALBUTEROL SULFATE (2.5 MG/3ML) 0.083% IN NEBU: 3.0000 mL | INHALATION_SOLUTION | Freq: Four times a day (QID) | RESPIRATORY_TRACT | Status: DC | PRN

## 2022-01-31 MED ORDER — METOCLOPRAMIDE HCL 5 MG/ML IJ SOLN
10.0000 mg | Freq: Once | INTRAMUSCULAR | Status: AC
  Administered 2022-01-31: 10 mg via INTRAVENOUS
  Filled 2022-01-31: qty 2

## 2022-01-31 MED ORDER — PANTOPRAZOLE SODIUM 40 MG IV SOLR
40.0000 mg | Freq: Two times a day (BID) | INTRAVENOUS | Status: DC
Start: 2022-01-31 — End: 2022-02-01
  Administered 2022-01-31 – 2022-02-01 (×2): 40 mg via INTRAVENOUS
  Filled 2022-01-31 (×2): qty 10

## 2022-01-31 MED ORDER — SUCRALFATE 1 GM/10ML PO SUSP
1.0000 g | Freq: Four times a day (QID) | ORAL | Status: DC
  Administered 2022-01-31 – 2022-02-02 (×6): 1 g via ORAL
  Filled 2022-01-31 (×8): qty 10

## 2022-01-31 MED ORDER — BUTALBITAL-APAP-CAFFEINE 50-325-40 MG PO TABS
1.0000 | ORAL_TABLET | Freq: Three times a day (TID) | ORAL | Status: AC | PRN
  Administered 2022-01-31 – 2022-02-01 (×3): 1 via ORAL
  Filled 2022-01-31 (×3): qty 1

## 2022-01-31 NOTE — ED Provider Notes (Signed)
[facility] Provider Note    Event Date/Time   First MD Initiated Contact with Patient 01/31/22 1533     (approximate)   History   Headache   HPI  Claire Rivas is a 31 y.o. female with PMH of major depressive disorder and recent lumbar puncture for MS work-up who presents with a headache over the last 3 days since the LP.  The patient presented to the ED that night and had symptomatic treatment.  Blood patch was discussed with IR, however after the patient improved she was discharged home.  The patient states that the next day and yesterday she felt somewhat better, and has been lying flat except to get up to go to the bathroom and to eat.  However today the headache, which never completely resolved, worsened again.  It is constant, worse when she stands up or moves around, involving her whole head, described as throbbing.  It is associated with photophobia and phonophobia as well as nausea and several episodes of vomiting.  The patient was able to keep a sandwich down yesterday.  She denies any fever or neck stiffness.  She denies any prior history of migraines.   Physical Exam   Triage Vital Signs: ED Triage Vitals  Enc Vitals Group     BP 01/31/22 1402 121/80     Pulse Rate 01/31/22 1402 69     Resp 01/31/22 1402 16     Temp 01/31/22 1402 98.4 F (36.9 C)     Temp Source 01/31/22 1402 Oral     SpO2 01/31/22 1402 98 %     Weight 01/31/22 1359 190 lb (86.2 kg)     Height 01/31/22 1359 5\' 5"  (1.651 m)     Head Circumference --      Peak Flow --      Pain Score 01/31/22 1359 9     Pain Loc --      Pain Edu? --      Excl. in GC? --     Most recent vital signs: Vitals:   01/31/22 1402 01/31/22 1705  BP: 121/80 106/67  Pulse: 69 85  Resp: 16 20  Temp: 98.4 F (36.9 C)   SpO2: 98% 100%     General: Awake, uncomfortable appearing but in no distress. CV:  Good peripheral perfusion.  Resp:  Normal effort.  Abd:  No distention.  Other:  EOMI.   PERRLA.  Photophobia.  Motor and sensory intact in all extremities.  Normal coordination with no ataxia on finger-to-nose.  No pronator drift.  Neck supple with full range of motion.  No meningeal signs.   ED Results / Procedures / Treatments   Labs (all labs ordered are listed, but only abnormal results are displayed) Labs Reviewed  BASIC METABOLIC PANEL - Abnormal; Notable for the following components:      Result Value   Potassium 3.3 (*)    Glucose, Bld 113 (*)    All other components within normal limits  CBC     EKG     RADIOLOGY    PROCEDURES:  Critical Care performed: No  Procedures   MEDICATIONS ORDERED IN ED: Medications  sodium chloride 0.9 % bolus 1,000 mL (1,000 mLs Intravenous New Bag/Given 01/31/22 1619)  metoCLOPramide (REGLAN) injection 10 mg (10 mg Intravenous Given 01/31/22 1620)  diphenhydrAMINE (BENADRYL) injection 25 mg (25 mg Intravenous Given 01/31/22 1620)  dexamethasone (DECADRON) injection 10 mg (10 mg Intravenous Given 01/31/22 1620)  IMPRESSION / MDM / ASSESSMENT AND PLAN / ED COURSE  I reviewed the triage vital signs and the nursing notes.  31 year old female with PMH as noted above presents with persistent headache after an LP performed for MS work-up 3 days ago.  I reviewed the past medical records; the patient had an LP performed under fluoroscopic guidance by Dr. Molli Posey on 2/16, with no complications per the note.  Subsequently the patient presented to the ED that evening with persistent headache.  She was treated with Reglan, Phenergan, and Benadryl.  ED providers discussed possibility of a blood patch with IR, however per IR the blood pressure would not be offered unless the patient had been able to lie flat for at least 24 hours and had not responded to symptomatic treatment.  On exam currently the patient is uncomfortable appearing but in no acute distress.  Her vital signs are normal.  The physical exam is unremarkable except  for photophobia.  Neurologic exam is normal.  There are no meningeal signs.  Overall presentation is most consistent with spinal headache given that it is worse with changes in position or sitting or standing up.  Differential also includes migraine.  Given the onset of the headache right after the LP and the normal neurologic exam there is no evidence of intracranial hemorrhage.  The patient is afebrile with no meningeal signs; there is no evidence of infectious etiology.  We will give fluids and symptomatic treatment with Reglan, Benadryl, and dexamethasone.  I will discuss with IR.  ----------------------------------------- 4:18 PM on 01/31/2022 -----------------------------------------  I consulted Dr. Lowella Dandy from interventional radiology who states that he does not perform blood patches and that there is nobody from radiology in house tonight who can offer this procedure.  However, he is able to arrange for inpatient or outpatient blood patch tomorrow if necessary.  I then consulted Dr. Laural Benes from anesthesiology who advises that his department only offers blood patches for patients who had received an epidural by anesthesiology, but not for spinal headache after IR procedure.  We will reassess the patient after the medications.  If she has intractable headache she may require admission for symptomatic treatment with a plan for blood patch tomorrow.  ----------------------------------------- 6:19 PM on 01/31/2022 -----------------------------------------  On reassessment the patient states the headache is improved but still present, and still gets severe whenever she moves around.  I have added on a CT to rule out other etiologies.  I consulted Dr. Allena Katz from the hospitalist service; based on our discussion she agrees to admit the patient for continued treatment of the headache.  Per IR, Dr. Elby Showers will be here tomorrow and should be able to to a blood patch.   FINAL CLINICAL IMPRESSION(S) /  ED DIAGNOSES   Final diagnoses:  Acute intractable headache, unspecified headache type     Rx / DC Orders   ED Discharge Orders     None        Note:  This document was prepared using Dragon voice recognition software and may include unintentional dictation errors.    Dionne Bucy, MD 01/31/22 1820

## 2022-01-31 NOTE — Assessment & Plan Note (Signed)
-  IV Protonix 

## 2022-01-31 NOTE — H&P (Signed)
History and Physical    Patient: Claire Rivas KPV:[id number] DOB: [birth year] DOA: 01/31/2022 DOS: the patient was seen and examined on 01/31/2022 PCP: Enid Baas, MD  Patient coming from: Home  Chief Complaint:  Chief Complaint  Patient presents with   Headache    HPI: Claire Rivas is a 31 y.o. female with medical history significant of allergies, reflux, tonsillitis, anxiety, depression and h/o suicide attempt and headaches comes to ed for headaches post spinal tap and need of blood patch that is anticipated if headache continues after tonight.  Per patient she had hearing loss in left ear and had imaging by ENT and saw some spots and was sent to neurology and had MRI and Now spinal tap with concern for MS. Per mom dad had some brain tumour and younger daughter had brain cyst and near relative has CNS lymphoma.  Pt is young anxious appearing cooperative patient and d/w her about her headache and counseling offered for 35 minutes for allergies and chronic headache gerd and long term lifestyle modification to prevent complication from long standing untreated allergies and NSAID use.  Pt verbalized understanding.    Review of Systems:  Review of Systems  Neurological:  Positive for headaches.  All other systems reviewed and are negative.  Past Medical History:  Diagnosis Date   Allergy    Anxiety    Asthma    Depression    Frequent headaches    GERD (gastroesophageal reflux disease)    History of chicken pox    Past Surgical History:  Procedure Laterality Date   WISDOM TOOTH EXTRACTION     Social History:  reports that she has never smoked. She has never used smokeless tobacco. She reports current alcohol use. She reports current drug use. Drug: Marijuana.  Allergies  Allergen Reactions   Misc. Sulfonamide Containing Compounds Other (See Comments) and Rash    Since infancy  Turned skin red   Shellfish-Derived Products Anaphylaxis and Swelling   Sulfa  Antibiotics Other (See Comments)    Turned skin red   Wound Dressing Adhesive Rash and Swelling    BAND-AID ADHESIVE   Shellfish Allergy Swelling   Dover Two-Sided Adhesive Strap [Attends Briefs Small] Rash   Latex Rash    Family History  Problem Relation Age of Onset   Mental illness Mother    Depression Mother    Diabetes Father        type 2   Arthritis Maternal Grandmother    Hyperlipidemia Maternal Grandmother    Heart disease Maternal Grandmother    Alcohol abuse Maternal Grandfather    Hypertension Maternal Grandfather    Diabetes Paternal Grandmother    Cancer Other        lung   Heart disease Other     Prior to Admission medications   Medication Sig Start Date End Date Taking? Authorizing Provider  albuterol (PROVENTIL HFA;VENTOLIN HFA) 108 (90 BASE) MCG/ACT inhaler Inhale 1 puff into the lungs every 6 (six) hours as needed for wheezing or shortness of breath. 06/20/15  Yes Rankin, Shuvon B, NP  metoCLOPramide (REGLAN) 10 MG tablet Take 1 tablet (10 mg total) by mouth every 8 (eight) hours as needed for nausea. 01/29/22 01/29/23 Yes Ward, Layla Maw, DO  omeprazole (PRILOSEC) 10 MG capsule Take 10 mg by mouth daily.   Yes [provider]  traZODone (DESYREL) 50 MG tablet Take 50 mg by mouth at bedtime as needed. 04/18/15  Yes [provider]  ADDERALL XR 20  MG 24 hr capsule Take 1 capsule (20 mg total) by mouth daily. 02/16/17   Sheliah Hatch, MD  EPINEPHrine (EPIPEN 2-PAK) 0.3 mg/0.3 mL IJ SOAJ injection Inject 0.3 mLs (0.3 mg total) into the muscle once. Patient not taking: Reported on 07/30/2016 06/20/15   Rankin, Shuvon B, NP  FLUoxetine (PROZAC) 20 MG capsule TAKE ONE CAPSULE BY MOUTH ONCE DAILY 02/25/17   Sheliah Hatch, MD  ondansetron (ZOFRAN-ODT) 4 MG disintegrating tablet Take 1 tablet (4 mg total) by mouth every 6 (six) hours as needed for nausea or vomiting. 01/29/22   Ward, Layla Maw, DO    Physical Exam: Vitals:   01/31/22 1402 01/31/22  1705 01/31/22 1921 01/31/22 1924  BP: 121/80 106/67 117/78   Pulse: 69 85 (!) 104   Resp: 16 20 16    Temp: 98.4 F (36.9 C)     TempSrc: Oral     SpO2: 98% 100% 98% 97%  Weight:      Height:      Physical Exam Vitals reviewed.  Constitutional:      General: She is not in acute distress.    Appearance: She is obese. She is not ill-appearing, toxic-appearing or diaphoretic.  HENT:     Head: Normocephalic and atraumatic.     Right Ear: Hearing normal. No decreased hearing noted. No laceration, drainage, swelling or tenderness. A middle ear effusion is present. There is no impacted cerumen. No foreign body. Tympanic membrane is not injected, scarred, perforated, erythematous, retracted or bulging. Tympanic membrane has normal mobility.     Left Ear: Hearing normal. No decreased hearing noted. No laceration, drainage, swelling or tenderness. A middle ear effusion is present. There is no impacted cerumen. No foreign body. Tympanic membrane is not injected, scarred, perforated, erythematous, retracted or bulging. Tympanic membrane has normal mobility.     Nose: Rhinorrhea present.     Right Turbinates: Pale.     Left Turbinates: Swollen.     Mouth/Throat:     Mouth: Mucous membranes are moist.     Tongue: Tongue does not deviate from midline.     Tonsils: 2+ on the right. 2+ on the left.  Eyes:     General: Lids are normal. Vision grossly intact. No scleral icterus.    Extraocular Movements: Extraocular movements intact.     Right eye: Normal extraocular motion.     Left eye: Normal extraocular motion.     Pupils: Pupils are equal, round, and reactive to light. Pupils are equal.  Neck:     Thyroid: Thyromegaly present.  Cardiovascular:     Rate and Rhythm: Normal rate and regular rhythm.     Pulses: No decreased pulses.          Dorsalis pedis pulses are 2+ on the right side and 2+ on the left side.       Posterior tibial pulses are 2+ on the right side and 2+ on the left side.      Heart sounds: Normal heart sounds.  Pulmonary:     Effort: Pulmonary effort is normal.     Breath sounds: Normal breath sounds and air entry.  Abdominal:     General: Abdomen is protuberant. There is no distension.     Palpations: Abdomen is soft. There is no hepatomegaly or splenomegaly.     Tenderness: There is no abdominal tenderness.  Musculoskeletal:     Cervical back: Normal range of motion and neck supple.     Right lower leg: No  edema.     Left lower leg: No edema.  Neurological:     Mental Status: She is alert.  Psychiatric:        Behavior: Behavior is cooperative.   Data Reviewed: >BMP ; potassium of 3.3, glucose of 113 o/w normal.  >CBC is normal.  >Resp panel is pending.  >CT head shows mucus retention cyst in left maxillary sinus.    Assessment and Plan: * Headache- (present on admission) D/d include post LP headache, rebound headache, gerd headache, sinus headache.  We will start pt on Fioricet for the caffeine and tylenol. Bed rest. PRN tylenol and lite diet. Treat the REFLUX and ALLERGIES. Advised pt to  follow with allergist as well.  PRN phenergan for nausea and zofran.  IV ppi.     Mucous retention cyst of maxillary sinus- (present on admission) Suspect this to be causing left ETD or allergies causing ETD and hearing issue.  Suspect pt may need ear tube.  And t/t of underlying allergies.   Multiple environmental allergies- (present on admission) D/w pt to consider starting zyrtec or xyzal/ and Singulair. modification at home and removing fabric and anything that can harbor allergens . Treat GERD with refraining from NSAIDS and change omeprazole to Nexium or Protonix.      GERD (gastroesophageal reflux disease)- (present on admission) IV Protonix.   Depression, major, single episode, severe (HCC)- (present on admission) Pt is not taking any meds currently.     Advance Care Planning:   Code Status: Full Code   Consults: None   Family  Communication:  Housand,Victor (Father)  [phone] ([place])  Severity of Illness: The appropriate patient status for this patient is OBSERVATION. Observation status is judged to be reasonable and necessary in order to provide the required intensity of service to ensure the patient's safety. The patient's presenting symptoms, physical exam findings, and initial radiographic and laboratory data in the context of their medical condition is felt to place them at decreased risk for further clinical deterioration. Furthermore, it is anticipated that the patient will be medically stable for discharge from the hospital within 2 midnights of admission.   Author: Gertha Calkin, MD 01/31/2022 8:33 PM  For on call review [email or web address].

## 2022-01-31 NOTE — Assessment & Plan Note (Signed)
Suspect this to be causing left ETD or allergies causing ETD and hearing issue.  Suspect pt may need ear tube.  And t/t of underlying allergies.

## 2022-01-31 NOTE — Assessment & Plan Note (Signed)
D/w pt to consider starting zyrtec or xyzal/ and Singulair. modification at home and removing fabric and anything that can harbor allergens . Treat GERD with refraining from NSAIDS and change omeprazole to Nexium or Protonix.

## 2022-01-31 NOTE — ED Notes (Signed)
Pt denies nausea. Resting in bed, mother at bedside.  Pharmacy tech in room as well.  Pt encouraged to let this RN know of any needs at this time.

## 2022-01-31 NOTE — ED Notes (Signed)
Patient transported to CT 

## 2022-01-31 NOTE — ED Notes (Signed)
Pt c/o "waves" f color

## 2022-01-31 NOTE — Assessment & Plan Note (Signed)
Pt is not taking any meds currently.

## 2022-01-31 NOTE — Assessment & Plan Note (Signed)
D/d include post LP headache, rebound headache, gerd headache, sinus headache.  We will start pt on Fioricet for the caffeine and tylenol. Bed rest. PRN tylenol and lite diet. Treat the REFLUX and ALLERGIES. Advised pt to  follow with allergist as well.  PRN phenergan for nausea and zofran.  IV ppi.

## 2022-01-31 NOTE — ED Triage Notes (Signed)
Pt via POV from home. Pt c/o headache and nausea since Thursday. Pt states she felt a little better after Thursday night. Pt had an LP done on Thursday. Pt states the headache has not gotten better since then. It has progressively gotten worse. Pt also endorses light and photosensitivity. Pt does have a hx of migraine.

## 2022-02-01 ENCOUNTER — Inpatient Hospital Stay: Payer: Medicaid Other | Admitting: Radiology

## 2022-02-01 DIAGNOSIS — R519 Headache, unspecified: Secondary | ICD-10-CM | POA: Diagnosis present

## 2022-02-01 HISTORY — PX: IR FL GUIDED LOC OF NEEDLE/CATH TIP FOR SPINAL INJECTION LT: IMG2396

## 2022-02-01 LAB — COMPREHENSIVE METABOLIC PANEL
ALT: 42 U/L (ref 0–44)
AST: 25 U/L (ref 15–41)
Albumin: 4 g/dL (ref 3.5–5.0)
Alkaline Phosphatase: 60 U/L (ref 38–126)
Anion gap: 6 (ref 5–15)
BUN: 10 mg/dL (ref 6–20)
CO2: 24 mmol/L (ref 22–32)
Calcium: 8.9 mg/dL (ref 8.9–10.3)
Chloride: 109 mmol/L (ref 98–111)
Creatinine, Ser: 0.6 mg/dL (ref 0.44–1.00)
GFR, Estimated: >60 mL/min (ref >60)
Glucose, Bld: 112 mg/dL — ABNORMAL HIGH (ref 70–99)
Potassium: 3.3 mmol/L — ABNORMAL LOW (ref 3.5–5.1)
Sodium: 139 mmol/L (ref 135–145)
Total Bilirubin: 0.5 mg/dL (ref 0.3–1.2)
Total Protein: 6.8 g/dL (ref 6.5–8.1)

## 2022-02-01 LAB — OLIGOCLONAL BANDS, CSF + SERM

## 2022-02-01 LAB — HIV ANTIBODY (ROUTINE TESTING W REFLEX): HIV Screen 4th Generation wRfx: NONREACTIVE

## 2022-02-01 LAB — CBC
HCT: 39.8 % (ref 36.0–46.0)
Hemoglobin: 13.5 g/dL (ref 12.0–15.0)
MCH: 29.5 pg (ref 26.0–34.0)
MCHC: 33.9 g/dL (ref 30.0–36.0)
MCV: 86.9 fL (ref 80.0–100.0)
Platelets: 239 10*3/uL (ref 150–400)
RBC: 4.58 MIL/uL (ref 3.87–5.11)
RDW: 11.6 % (ref 11.5–15.5)
WBC: 11.5 10*3/uL — ABNORMAL HIGH (ref 4.0–10.5)
nRBC: 0 % (ref 0.0–0.2)

## 2022-02-01 MED ORDER — PANTOPRAZOLE SODIUM 40 MG PO TBEC
40.0000 mg | DELAYED_RELEASE_TABLET | Freq: Every day | ORAL | Status: DC
  Administered 2022-02-02: 40 mg via ORAL
  Filled 2022-02-01: qty 1

## 2022-02-01 MED ORDER — FENTANYL CITRATE (PF) 100 MCG/2ML IJ SOLN
INTRAMUSCULAR | Status: DC | PRN
  Administered 2022-02-01: 50 ug via INTRAVENOUS

## 2022-02-01 MED ORDER — LIDOCAINE HCL (PF) 1 % IJ SOLN
INTRAMUSCULAR | Status: DC | PRN
  Administered 2022-02-01: 6 mL via SUBCUTANEOUS

## 2022-02-01 MED ORDER — METRONIDAZOLE 500 MG PO TABS
500.0000 mg | ORAL_TABLET | Freq: Two times a day (BID) | ORAL | Status: DC
  Administered 2022-02-01 – 2022-02-02 (×2): 500 mg via ORAL
  Filled 2022-02-01 (×2): qty 1

## 2022-02-01 MED ORDER — POTASSIUM CHLORIDE CRYS ER 20 MEQ PO TBCR
40.0000 meq | EXTENDED_RELEASE_TABLET | Freq: Once | ORAL | Status: AC
  Administered 2022-02-01: 08:00:00 40 meq via ORAL
  Filled 2022-02-01: qty 2

## 2022-02-01 MED ORDER — IOHEXOL 180 MG/ML  SOLN
5.0000 mL | Freq: Once | INTRAMUSCULAR | Status: AC | PRN
  Administered 2022-02-01: 5 mL via INTRATHECAL

## 2022-02-01 MED ORDER — FENTANYL CITRATE (PF) 100 MCG/2ML IJ SOLN
INTRAMUSCULAR | Status: AC
  Filled 2022-02-01: qty 2

## 2022-02-01 MED ORDER — ACETAMINOPHEN 325 MG PO TABS: 650.0000 mg | ORAL_TABLET | Freq: Four times a day (QID) | ORAL | Status: DC | PRN

## 2022-02-01 MED ORDER — ACETAMINOPHEN 325 MG PO TABS
650.0000 mg | ORAL_TABLET | Freq: Four times a day (QID) | ORAL | Status: DC | PRN
  Administered 2022-02-02 (×2): 650 mg via ORAL
  Filled 2022-02-01 (×2): qty 2

## 2022-02-01 NOTE — Progress Notes (Signed)
Progress Note:    Claire Rivas    [id number] DOB: 21-Nov-1991 DOA: 01/31/2022  PCP: Gladstone Lighter, MD    Brief Narrative:   31 year old female with a past medical history of allergies, GERD, tonsillitis, depression/anxiety, history of suicide attempt.  Admitted for post LP headaches.  LP was done by Dr. Tery Sanfilippo for work-up for possible MS  this past Thursday.  Was in the emergency department on Thursday evening and then discharged home.  Headaches not improving.  Now consideration for blood patch.     Subjective:   No acute events overnight.  Lying flat all night and still headaches persist although improved with Fioricet.  Currently 4 out of 10.  Worsened with light and sound.    Assessment and Plan:   Intractable headache: Likely secondary to post LP headache with no history of migraines.  Continue Fioricet as needed.  Discussed with Dr. Serafina Royals and the plan is for blood patch consideration tomorrow.  N.p.o. after midnight.  Mucous retention cyst left maxillary sinus: Follow-up with ENT outpatient  Multiple environmental allergies: Consider starting Zyrtec or Xyzal  GERD: Continue Protonix  Depression: Patient not taking any meds currently    Other information:    DVT prophylaxis: SCDs Code Status: Full code Family Communication: None Disposition:   Status is: Observation The patient will require care spanning > 2 midnights and should be moved to inpatient because: Requiring blood patch that is planned for tomorrow with IR.             Consultants:   Interventional radiology    Objective:    Vitals:   01/31/22 1924 01/31/22 2126 02/01/22 0432 02/01/22 0812  BP:  110/74 114/69 105/74  Pulse:  94 87 73  Resp:  18 18 16   Temp:  98.5 F (36.9 C) 97.9 F (36.6 C) 97.6 F (36.4 C)  TempSrc:  Oral Oral Oral  SpO2: 97% 99% 98% 100%  Weight:      Height:        Intake/Output Summary (Last 24 hours) at 02/01/2022 1325 Last data  filed at 02/01/2022 1200 Gross per 24 hour  Intake 240 ml  Output --  Net 240 ml   Filed Weights   01/31/22 1359  Weight: 86.2 kg       Physical Exam:    General exam: Appears calm and comfortable, wet towel resting on forehead Respiratory system: Clear to auscultation. Respiratory effort normal. Cardiovascular system: S1 & S2 heard, RRR. No JVD, murmurs, rubs, gallops or clicks. No pedal edema. Gastrointestinal system: Abdomen is nondistended, soft and nontender. No organomegaly or masses felt. Normal bowel sounds heard. Central nervous system: Alert and oriented. No focal neurological deficits. Extremities: Symmetric 5 x 5 power. Skin: No rashes, lesions or ulcers Psychiatry: Judgement and insight appear normal. Mood & affect appropriate.     Data Reviewed:    I have personally reviewed following labs and imaging studies  CBC: Recent Labs  Lab 01/28/22 2218 01/31/22 1403 02/01/22 0429  WBC 9.3 5.9 11.5*  NEUTROABS 8.1*  --   --   HGB 13.9 14.1 13.5  HCT 40.4 41.5 39.8  MCV 86.5 85.6 86.9  PLT 189 238 [id number]    Basic Metabolic Panel: Recent Labs  Lab 01/28/22 2218 01/31/22 1403 02/01/22 0429  NA 136 138 139  K 3.4* 3.3* 3.3*  CL 107 106 109  CO2 22 23 24   GLUCOSE 94 113* 112*  BUN 12 11 10   CREATININE 0.52  0.63 0.60  CALCIUM 8.7* 8.9 8.9    GFR: Estimated Creatinine Clearance: 112.5 mL/min (by C-G formula based on SCr of 0.6 mg/dL).  Liver Function Tests: Recent Labs  Lab 01/28/22 2218 02/01/22 0429  AST 16 25  ALT 26 42  ALKPHOS 65 60  BILITOT 0.8 0.5  PROT 6.5 6.8  ALBUMIN 4.0 4.0    CBG: No results for input(s): GLUCAP in the last 168 hours.   Recent Results (from the past 240 hour(s))  Resp Panel by RT-PCR (Flu A&B, Covid) Nasopharyngeal Swab     Status: None   Collection Time: 01/31/22  7:47 PM   Specimen: Nasopharyngeal Swab; Nasopharyngeal(NP) swabs in vial transport medium  Result Value Ref Range Status   SARS Coronavirus 2  by RT PCR NEGATIVE NEGATIVE Final    Comment: (NOTE) SARS-CoV-2 target nucleic acids are NOT DETECTED.  The SARS-CoV-2 RNA is generally detectable in upper respiratory specimens during the acute phase of infection. The lowest concentration of SARS-CoV-2 viral copies this assay can detect is 138 copies/mL. A negative result does not preclude SARS-Cov-2 infection and should not be used as the sole basis for treatment or other patient management decisions. A negative result may occur with  improper specimen collection/handling, submission of specimen other than nasopharyngeal swab, presence of viral mutation(s) within the areas targeted by this assay, and inadequate number of viral copies(<138 copies/mL). A negative result must be combined with clinical observations, patient history, and epidemiological information. The expected result is Negative.  Fact Sheet for Patients:  EntrepreneurPulse.com.au  Fact Sheet for Healthcare Providers:  IncredibleEmployment.be  This test is no t yet approved or cleared by the [place] FDA and  has been authorized for detection and/or diagnosis of SARS-CoV-2 by FDA under an Emergency Use Authorization (EUA). This EUA will remain  in effect (meaning this test can be used) for the duration of the COVID-19 declaration under Section 564(b)(1) of the Act, 21 U.S.C.section 360bbb-3(b)(1), unless the authorization is terminated  or revoked sooner.       Influenza A by PCR NEGATIVE NEGATIVE Final   Influenza B by PCR NEGATIVE NEGATIVE Final    Comment: (NOTE) The Xpert Xpress SARS-CoV-2/FLU/RSV plus assay is intended as an aid in the diagnosis of influenza from Nasopharyngeal swab specimens and should not be used as a sole basis for treatment. Nasal washings and aspirates are unacceptable for Xpert Xpress SARS-CoV-2/FLU/RSV testing.  Fact Sheet for Patients: EntrepreneurPulse.com.au  Fact  Sheet for Healthcare Providers: IncredibleEmployment.be  This test is not yet approved or cleared by the [place] FDA and has been authorized for detection and/or diagnosis of SARS-CoV-2 by FDA under an Emergency Use Authorization (EUA). This EUA will remain in effect (meaning this test can be used) for the duration of the COVID-19 declaration under Section 564(b)(1) of the Act, 21 U.S.C. section 360bbb-3(b)(1), unless the authorization is terminated or revoked.  Performed at [facility], [address], [place], [place] [address]          Radiology Studies:    CT Head Wo Contrast  Result Date: 01/31/2022 CLINICAL DATA:  Headache, new or worsening, positional (Age 34-49y) Patient reports headache and nausea for 3 days. EXAM: CT HEAD WITHOUT CONTRAST TECHNIQUE: Contiguous axial images were obtained from the base of the skull through the vertex without intravenous contrast. RADIATION DOSE REDUCTION: This exam was performed according to the departmental dose-optimization program which includes automated exposure control, adjustment of the mA and/or kV according to patient size  and/or use of iterative reconstruction technique. COMPARISON:  Brain MRI 12/11/2021 FINDINGS: Brain: No intracranial hemorrhage, mass effect, or midline shift. No CT correlate for the small T2 hyperintensities on prior MRI. No hydrocephalus. The basilar cisterns are patent. No evidence of territorial infarct or acute ischemia. No extra-axial or intracranial fluid collection. Vascular: No hyperdense vessel or unexpected calcification. Skull: Normal. Negative for fracture or focal lesion. Sinuses/Orbits: Tiny mucous retention cyst in the left maxillary sinus. Otherwise negative. Other: None. IMPRESSION: No acute intracranial abnormality. Electronically Signed   By: Keith Rake M.D.   On: 01/31/2022 18:32        Medications:    Scheduled Meds:  metroNIDAZOLE  500 mg Oral BID    pantoprazole (PROTONIX) IV  40 mg Intravenous Q12H   sucralfate  1 g Oral Q6H   Continuous Infusions:     LOS: 0 days    Time spent: 35 minutes    Leslee Home, MD [facility]   To contact the attending provider between 7A-7P or the covering provider during after hours 7P-7A, please log into the web site [email or web address] and access using universal [place] password for that web site. If you do not have the password, please call the hospital operator.  02/01/2022, 1:25 PM

## 2022-02-02 DIAGNOSIS — R519 Headache, unspecified: Secondary | ICD-10-CM | POA: Diagnosis not present

## 2022-02-02 LAB — BASIC METABOLIC PANEL
Anion gap: 9 (ref 5–15)
BUN: 13 mg/dL (ref 6–20)
CO2: 23 mmol/L (ref 22–32)
Calcium: 8.4 mg/dL — ABNORMAL LOW (ref 8.9–10.3)
Chloride: 109 mmol/L (ref 98–111)
Creatinine, Ser: 0.68 mg/dL (ref 0.44–1.00)
GFR, Estimated: >60 mL/min (ref >60)
Glucose, Bld: 139 mg/dL — ABNORMAL HIGH (ref 70–99)
Potassium: 3.3 mmol/L — ABNORMAL LOW (ref 3.5–5.1)
Sodium: 141 mmol/L (ref 135–145)

## 2022-02-02 LAB — MAGNESIUM: Magnesium: 1.7 mg/dL (ref 1.7–2.4)

## 2022-02-02 MED ORDER — CYCLOBENZAPRINE HCL 10 MG PO TABS
5.0000 mg | ORAL_TABLET | Freq: Once | ORAL | Status: AC
  Administered 2022-02-02: 02:00:00 5 mg via ORAL
  Filled 2022-02-02: qty 1

## 2022-02-02 MED ORDER — POLYETHYLENE GLYCOL 3350 17 G PO PACK: 17.0000 g | PACK | Freq: Every day | ORAL | Status: DC

## 2022-02-02 MED ORDER — LIDOCAINE 5 % EX PTCH
1.0000 | MEDICATED_PATCH | CUTANEOUS | Status: DC
  Administered 2022-02-02: 1 via TRANSDERMAL
  Filled 2022-02-02: qty 1

## 2022-02-02 MED ORDER — LIDOCAINE 5 % EX PTCH: 1.0000 | MEDICATED_PATCH | CUTANEOUS | 0 refills | Status: DC

## 2022-02-02 NOTE — Discharge Summary (Signed)
Physician Discharge Summary   Patient: Claire Rivas MRN: [id number] DOB: 1991-01-30  Admit date:     01/31/2022  Discharge date: 02/02/2022  Discharge Physician: Arnetha Courser   PCP: Enid Baas, MD   Recommendations at discharge:  Follow-up with primary care doctor Follow-up with neurology  Discharge Diagnoses: Principal Problem:   Headache Active Problems:   Depression, major, single episode, severe (HCC)   GERD (gastroesophageal reflux disease)   Multiple environmental allergies   Mucous retention cyst of maxillary sinus   New onset of headaches   Hospital Course: 31 year old female with a past medical history of allergies, GERD, tonsillitis, depression/anxiety, history of suicide attempt.  Admitted for post LP headaches.  LP was done by Dr. Molli Posey for work-up for possible MS  this past Thursday.  Was in the emergency department on Thursday evening and then discharged home.  She came back for persistent headache.  She received a blood patch which resulted in improvement in headache. Was discharged next day with improved headache.  She was having some heaviness in ear but no pain.  She needs to follow-up with her primary care provider and neurologist for further recommendations.  CT scan also shows some mucous retention cyst in left maxillary sinus which need outpatient ENT evaluation.  Assessment and Plan: * Headache- (present on admission) D/d include post LP headache, rebound headache, gerd headache, sinus headache.  We will start pt on Fioricet for the caffeine and tylenol. Bed rest. PRN tylenol and lite diet. Treat the REFLUX and ALLERGIES. Advised pt to  follow with allergist as well.  PRN phenergan for nausea and zofran.  IV ppi.     Mucous retention cyst of maxillary sinus- (present on admission) Suspect this to be causing left ETD or allergies causing ETD and hearing issue.  Suspect pt may need ear tube.  And t/t of underlying allergies.   Multiple  environmental allergies- (present on admission) D/w pt to consider starting zyrtec or xyzal/ and Singulair. modification at home and removing fabric and anything that can harbor allergens . Treat GERD with refraining from NSAIDS and change omeprazole to Nexium or Protonix.      GERD (gastroesophageal reflux disease)- (present on admission) IV Protonix.   Depression, major, single episode, severe (HCC)- (present on admission) Pt is not taking any meds currently.     Consultants: Interventional radiology Procedures performed: Blood patch Disposition: Home Diet recommendation:  Discharge Diet Orders (From admission, onward)     Start     Ordered   02/02/22 0000  Diet - low sodium heart healthy        02/02/22 1306           Regular diet  DISCHARGE MEDICATION: Allergies as of 02/02/2022       Reactions   Misc. Sulfonamide Containing Compounds Other (See Comments), Rash   Since infancy Turned skin red   Shellfish-derived Products Anaphylaxis, Swelling   Sulfa Antibiotics Other (See Comments)   Turned skin red   Wound Dressing Adhesive Rash, Swelling   BAND-AID ADHESIVE   Shellfish Allergy Swelling   Dover Two-sided Adhesive Strap [attends Briefs Small] Rash   Latex Rash        Medication List     STOP taking these medications    Adderall XR 20 MG 24 hr capsule Generic drug: amphetamine-dextroamphetamine   FLUoxetine 20 MG capsule Commonly known as: PROZAC   ondansetron 4 MG disintegrating tablet Commonly known as: ZOFRAN-ODT   traZODone 50 MG tablet Commonly known  as: DESYREL   triamcinolone cream 0.5 % Commonly known as: KENALOG       TAKE these medications    albuterol 108 (90 Base) MCG/ACT inhaler Commonly known as: VENTOLIN HFA Inhale 1 puff into the lungs every 6 (six) hours as needed for wheezing or shortness of breath.   clobetasol 0.05 % external solution Commonly known as: TEMOVATE Apply topically daily.   EPINEPHrine 0.3 mg/0.3  mL Soaj injection Commonly known as: EpiPen 2-Pak Inject 0.3 mLs (0.3 mg total) into the muscle once.   ketoconazole 2 % shampoo Commonly known as: NIZORAL SHAMPOO 2 TO 3 TIMES WEEKLY TO SCALP   levonorgestrel 20 MCG/DAY Iud Commonly known as: MIRENA by Intrauterine route.   lidocaine 5 % Commonly known as: LIDODERM Place 1 patch onto the skin daily. Remove & Discard patch within 12 hours or as directed by MD Start taking on: February 03, 2022   metoCLOPramide 10 MG tablet Commonly known as: REGLAN Take 1 tablet (10 mg total) by mouth every 8 (eight) hours as needed for nausea.   metroNIDAZOLE 500 MG tablet Commonly known as: FLAGYL Take 1 tablet by mouth 2 (two) times daily.   mupirocin ointment 2 % Commonly known as: BACTROBAN Apply topically 3 (three) times daily.   omeprazole 10 MG capsule Commonly known as: PRILOSEC Take 10 mg by mouth daily.   valACYclovir 500 MG tablet Commonly known as: VALTREX Take 500 mg by mouth daily as needed.        Follow-up Information     Enid Baas, MD. Schedule an appointment as soon as possible for a visit in 1 week(s).   Specialty: Internal Medicine Contact information: [address] [place] [address] [phone]                 Discharge Exam: Ceasar Mons Weights   01/31/22 1359  Weight: 86.2 kg   General.     In no acute distress. Pulmonary.  Lungs clear bilaterally, normal respiratory effort. CV.  Regular rate and rhythm, no JVD, rub or murmur. Abdomen.  Soft, nontender, nondistended, BS positive. CNS.  Alert and oriented x3.  No focal neurologic deficit. Extremities.  No edema, no cyanosis, pulses intact and symmetrical. Psychiatry.  Judgment and insight appears normal.   Condition at discharge: good  The results of significant diagnostics from this hospitalization (including imaging, microbiology, ancillary and laboratory) are listed below for reference.   Imaging Studies: CT Head Wo  Contrast  Result Date: 01/31/2022 CLINICAL DATA:  Headache, new or worsening, positional (Age 49-49y) Patient reports headache and nausea for 3 days. EXAM: CT HEAD WITHOUT CONTRAST TECHNIQUE: Contiguous axial images were obtained from the base of the skull through the vertex without intravenous contrast. RADIATION DOSE REDUCTION: This exam was performed according to the departmental dose-optimization program which includes automated exposure control, adjustment of the mA and/or kV according to patient size and/or use of iterative reconstruction technique. COMPARISON:  Brain MRI 12/11/2021 FINDINGS: Brain: No intracranial hemorrhage, mass effect, or midline shift. No CT correlate for the small T2 hyperintensities on prior MRI. No hydrocephalus. The basilar cisterns are patent. No evidence of territorial infarct or acute ischemia. No extra-axial or intracranial fluid collection. Vascular: No hyperdense vessel or unexpected calcification. Skull: Normal. Negative for fracture or focal lesion. Sinuses/Orbits: Tiny mucous retention cyst in the left maxillary sinus. Otherwise negative. Other: None. IMPRESSION: No acute intracranial abnormality. Electronically Signed   By: Narda Rutherford M.D.   On: 01/31/2022 18:32   MR  LUMBAR SPINE WO CONTRAST  Result Date: 01/28/2022 CLINICAL DATA:  Low back pain EXAM: MRI LUMBAR SPINE WITHOUT CONTRAST TECHNIQUE: Multiplanar, multisequence MR imaging of the lumbar spine was performed. No intravenous contrast was administered. COMPARISON:  None. FINDINGS: Segmentation:  Standard. Alignment:  Physiologic. Vertebrae:  No fracture, evidence of discitis, or bone lesion. Conus medullaris and cauda equina: Conus extends to the L1 level. Conus and cauda equina appear normal. Paraspinal and other soft tissues: Negative. Disc levels: The T11-L4 levels are normal. L4-5: Small central disc protrusion without stenosis. L5-S1: Normal. IMPRESSION: Small central disc protrusion at L4-L5 without  stenosis. Otherwise normal lumbar spine. Electronically Signed   By: Deatra Robinson M.D.   On: 01/28/2022 23:54   MR CERVICAL SPINE W WO CONTRAST  Result Date: 01/22/2022 CLINICAL DATA:  Initial evaluation for possible demyelinating disease. EXAM: MRI CERVICAL AND THORACIC SPINE WITH AND WITHOUT CONTRAST TECHNIQUE: Multiplanar and multiecho pulse sequences of the cervical spine, to include the craniocervical junction and cervicothoracic junction, and thoracic spine, were obtained with intravenous contrast. CONTRAST:  7.45mL GADAVIST GADOBUTROL 1 MMOL/ML IV SOLN COMPARISON:  None. FINDINGS: MRI CERVICAL SPINE FINDINGS Alignment: Straightening of the normal cervical lordosis. No listhesis. Vertebrae: Vertebral body height maintained without acute or chronic fracture. Bone marrow signal intensity somewhat diffusely decreased on T1 weighted imaging, nonspecific, but most commonly related to anemia, smoking, or obesity. No discrete or worrisome osseous lesions. No abnormal marrow edema or enhancement. Cord: Normal signal and morphology. No cord signal changes to suggest demyelinating disease. No abnormal enhancement. Posterior Fossa, vertebral arteries, paraspinal tissues: Unremarkable. Disc levels: No significant disc pathology seen within the thoracic spine. No canal or foraminal stenosis or evidence for neural impingement. MRI THORACIC SPINE FINDINGS Alignment: Trace scoliosis. Alignment otherwise normal with preservation of the normal thoracic kyphosis. No listhesis. Vertebrae: Vertebral body height maintained without acute or chronic fracture. Bone marrow signal intensity somewhat diffusely decreased on T1 weighted imaging, nonspecific, but most commonly related to anemia, smoking or obesity. No worrisome osseous lesions. No abnormal marrow edema or enhancement. Cord: Small focus of signal abnormality seen involving the left dorsal cord at the level of T10, suspicious for a small demyelinating lesion (series 23,  image 30). No associated enhancement to suggest active demyelination. Otherwise, no other signal abnormality seen within the thoracic cord. Normal cord caliber and morphology. Paraspinal and other soft tissues: Unremarkable. Disc levels: No significant disc pathology seen within the thoracic spine. No disc bulge or focal disc herniation. No stenosis or impingement. IMPRESSION: 1. Small focus of signal abnormality involving the left dorsal cord at the level of T10, suspicious for a small demyelinating lesion. No associated enhancement to suggest active demyelination. 2. Otherwise normal MRI of the cervical and thoracic spine. No other evidence for spinal cord involvement of demyelinating disease. 3. No significant disc pathology, stenosis, or neural impingement. Electronically Signed   By: Rise Mu M.D.   On: 01/22/2022 19:46   MR THORACIC SPINE W WO CONTRAST  Result Date: 01/22/2022 CLINICAL DATA:  Initial evaluation for possible demyelinating disease. EXAM: MRI CERVICAL AND THORACIC SPINE WITH AND WITHOUT CONTRAST TECHNIQUE: Multiplanar and multiecho pulse sequences of the cervical spine, to include the craniocervical junction and cervicothoracic junction, and thoracic spine, were obtained with intravenous contrast. CONTRAST:  7.41mL GADAVIST GADOBUTROL 1 MMOL/ML IV SOLN COMPARISON:  None. FINDINGS: MRI CERVICAL SPINE FINDINGS Alignment: Straightening of the normal cervical lordosis. No listhesis. Vertebrae: Vertebral body height maintained without acute or chronic fracture. Bone marrow signal  intensity somewhat diffusely decreased on T1 weighted imaging, nonspecific, but most commonly related to anemia, smoking, or obesity. No discrete or worrisome osseous lesions. No abnormal marrow edema or enhancement. Cord: Normal signal and morphology. No cord signal changes to suggest demyelinating disease. No abnormal enhancement. Posterior Fossa, vertebral arteries, paraspinal tissues: Unremarkable. Disc  levels: No significant disc pathology seen within the thoracic spine. No canal or foraminal stenosis or evidence for neural impingement. MRI THORACIC SPINE FINDINGS Alignment: Trace scoliosis. Alignment otherwise normal with preservation of the normal thoracic kyphosis. No listhesis. Vertebrae: Vertebral body height maintained without acute or chronic fracture. Bone marrow signal intensity somewhat diffusely decreased on T1 weighted imaging, nonspecific, but most commonly related to anemia, smoking or obesity. No worrisome osseous lesions. No abnormal marrow edema or enhancement. Cord: Small focus of signal abnormality seen involving the left dorsal cord at the level of T10, suspicious for a small demyelinating lesion (series 23, image 30). No associated enhancement to suggest active demyelination. Otherwise, no other signal abnormality seen within the thoracic cord. Normal cord caliber and morphology. Paraspinal and other soft tissues: Unremarkable. Disc levels: No significant disc pathology seen within the thoracic spine. No disc bulge or focal disc herniation. No stenosis or impingement. IMPRESSION: 1. Small focus of signal abnormality involving the left dorsal cord at the level of T10, suspicious for a small demyelinating lesion. No associated enhancement to suggest active demyelination. 2. Otherwise normal MRI of the cervical and thoracic spine. No other evidence for spinal cord involvement of demyelinating disease. 3. No significant disc pathology, stenosis, or neural impingement. Electronically Signed   By: Rise Mu M.D.   On: 01/22/2022 19:46   IR FL GUIDED LOC OF NEEDL/CATH TIP FOR SPINAL INJECT LT  Result Date: 02/01/2022 CLINICAL DATA:  31 year old female with history of demyelinating disease status post fluoroscopic guided lumbar puncture at L2-L3 and L3-L4 on 01/28/2022 presenting with persistent headaches in the setting of presumed intracranial hypotension. EXAM: Fluoroscopic guided  epidural autologous blood patch. PROCEDURE: The procedure, risks, benefits, and alternatives were explained to the patient. Questions regarding the procedure were encouraged and answered. The patient understands and consents to the procedure. LUMBAR EPIDURAL BLOOD PATCH: 20 ml of blood were withdrawn from the patient's antecubital fossa. An epidural approach was taken on the right at L3-L4 using a 3.5 inch 20 gauge epidural needle. Epidural positioning was confirmed by injecting a small amount Omnipaque 180. There was no vascular communication. Twenty ml of the patient's blood was slowly injected into the epidural space in this location. The procedure was well-tolerated and she was discharged in good condition with instructions to lie down for additional day. IMPRESSION: Lumbar epidural blood patch on the right atL3-L4. Marliss Coots, MD [facility] Electronically Signed   By: Marliss Coots M.D.   On: 02/01/2022 15:39   DG FLUORO GUIDED LOC OF NEEDLE/CATH TIP FOR SPINAL INJECT LT  Result Date: 01/28/2022 INDICATION: Demyelinating disease. EXAM: FLUOROSCOPIC GUIDED LUMBAR PUNCTURE WITH OPENING AND CLOSING PRESSURES COMPARISON:  Cervicothoracic spine MRI 01/22/2022. MEDICATIONS: None. CONTRAST:  None. FLUOROSCOPY TIME:  5.7 mGy COMPLICATIONS: None immediate TECHNIQUE: Informed written consent was obtained from the patient after a discussion of the risks, benefits and alternatives to treatment. Questions regarding the procedure were encouraged and answered. A timeout was performed prior to the initiation of the procedure. The patient was placed prone on the fluoroscopy table. The L2-3 and L3-4 levels were marked fluoroscopically. The skin overlying the operative site was  prepped and draped in the usual sterile fashion. Local anesthesia was provided with 1% lidocaine. Under intermittent fluoroscopic guidance, a 22 gauge spinal needle was advanced at L2-3. No  CSF return could be obtained at this level. Subsequently, puncture was performed at L3-4 and appropriate positioning was confirmed with the efflux of blood-tinged CSF. Opening pressure was obtained. Approximately 5-6 ml of cerebrospinal fluid was collected into four separate containers. Closing pressure was obtained. Samples were sent to the Laboratory as requested per the clinical team. The needle was removed and hemostasis was achieved with manual compression. A dressing was placed. The patient tolerated the above procedure well without immediate postprocedural complication. FINDINGS: Fluoroscopic imaging demonstrates appropriate positioning of the spinal needle within the spinal canal. Successful fluoroscopic lumbar puncture yielding approximately 6 ml of initially blood-tinged cerebrospinal fluid that cleared over the four tubes. Opening pressure - 15 mmHg Closing pressure - 14 mmHg. IMPRESSION: Technically successful fluoroscopic guided lumbar puncture. Samples were sent to the Laboratory as requested per the clinical team. No evidence for immediate complications. Electronically Signed   By: Kennith CenterEric  Mansell M.D.   On: 01/28/2022 12:34    Microbiology: Results for orders placed or performed during the hospital encounter of 01/31/22  Resp Panel by RT-PCR (Flu A&B, Covid) Nasopharyngeal Swab     Status: None   Collection Time: 01/31/22  7:47 PM   Specimen: Nasopharyngeal Swab; Nasopharyngeal(NP) swabs in vial transport medium  Result Value Ref Range Status   SARS Coronavirus 2 by RT PCR NEGATIVE NEGATIVE Final    Comment: (NOTE) SARS-CoV-2 target nucleic acids are NOT DETECTED.  The SARS-CoV-2 RNA is generally detectable in upper respiratory specimens during the acute phase of infection. The lowest concentration of SARS-CoV-2 viral copies this assay can detect is 138 copies/mL. A negative result does not preclude SARS-Cov-2 infection and should not be used as the sole basis for treatment or other  patient management decisions. A negative result may occur with  improper specimen collection/handling, submission of specimen other than nasopharyngeal swab, presence of viral mutation(s) within the areas targeted by this assay, and inadequate number of viral copies(<138 copies/mL). A negative result must be combined with clinical observations, patient history, and epidemiological information. The expected result is Negative.  Fact Sheet for Patients:  BloggerCourse.comhttps://www.fda.gov/media/152166/download  Fact Sheet for Healthcare Providers:  SeriousBroker.ithttps://www.fda.gov/media/152162/download  This test is no t yet approved or cleared by the [place] FDA and  has been authorized for detection and/or diagnosis of SARS-CoV-2 by FDA under an Emergency Use Authorization (EUA). This EUA will remain  in effect (meaning this test can be used) for the duration of the COVID-19 declaration under Section 564(b)(1) of the Act, 21 U.S.C.section 360bbb-3(b)(1), unless the authorization is terminated  or revoked sooner.       Influenza A by PCR NEGATIVE NEGATIVE Final   Influenza B by PCR NEGATIVE NEGATIVE Final    Comment: (NOTE) The Xpert Xpress SARS-CoV-2/FLU/RSV plus assay is intended as an aid in the diagnosis of influenza from Nasopharyngeal swab specimens and should not be used as a sole basis for treatment. Nasal washings and aspirates are unacceptable for Xpert Xpress SARS-CoV-2/FLU/RSV testing.  Fact Sheet for Patients: BloggerCourse.comhttps://www.fda.gov/media/152166/download  Fact Sheet for Healthcare Providers: SeriousBroker.ithttps://www.fda.gov/media/152162/download  This test is not yet approved or cleared by the [place] FDA and has been authorized for detection and/or diagnosis of SARS-CoV-2 by FDA under an Emergency Use Authorization (EUA). This EUA will remain in effect (meaning this test can be used) for the duration  of the COVID-19 declaration under Section 564(b)(1) of the Act, 21 U.S.C. section  360bbb-3(b)(1), unless the authorization is terminated or revoked.  Performed at [facility], [address] [place], [place] [id number]     Labs: CBC: Recent Labs  Lab 01/28/22 2218 01/31/22 1403 02/01/22 0429  WBC 9.3 5.9 11.5*  NEUTROABS 8.1*  --   --   HGB 13.9 14.1 13.5  HCT 40.4 41.5 39.8  MCV 86.5 85.6 86.9  PLT 189 238 239   Basic Metabolic Panel: Recent Labs  Lab 01/28/22 2218 01/31/22 1403 02/01/22 0429 02/02/22 1049  NA 136 138 139 141  K 3.4* 3.3* 3.3* 3.3*  CL 107 106 109 109  CO2 22 23 24 23   GLUCOSE 94 113* 112* 139*  BUN 12 11 10 13   CREATININE 0.52 0.63 0.60 0.68  CALCIUM 8.7* 8.9 8.9 8.4*  MG  --   --   --  1.7   Liver Function Tests: Recent Labs  Lab 01/28/22 2218 02/01/22 0429  AST 16 25  ALT 26 42  ALKPHOS 65 60  BILITOT 0.8 0.5  PROT 6.5 6.8  ALBUMIN 4.0 4.0   CBG: No results for input(s): GLUCAP in the last 168 hours.  Discharge time spent: greater than 30 minutes.  Signed: Arnetha CourserSumayya Kelee Cunningham, MD [facility] 02/02/2022

## 2022-02-02 NOTE — Progress Notes (Signed)
Patient discharged to home, AVS reviewed and all questions answered.  

## 2022-04-13 ENCOUNTER — Ambulatory Visit: Payer: Self-pay | Admitting: Surgery
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
## 2022-07-06 ENCOUNTER — Inpatient Hospital Stay: Admission: RE | Admit: 2022-07-06 | Payer: Medicaid Other | Source: Ambulatory Visit

## 2022-07-07 ENCOUNTER — Other Ambulatory Visit: Payer: Medicaid Other

## 2022-07-15 ENCOUNTER — Ambulatory Visit: Admit: 2022-07-15 | Payer: Medicaid Other | Admitting: Surgery

## 2022-07-15 SURGERY — HEMORRHOIDECTOMY
Anesthesia: General | Site: Rectum

## 2022-07-15 SURGERY — EXAM UNDER ANESTHESIA WITH HEMORRHOIDECTOMY
Anesthesia: General | Site: Rectum

## 2023-06-07 IMAGING — CT CT HEAD W/O CM
4 series · 17 of 47 positions shown, 19 images · non-contrast
Comparison: Brain MRI 12/11/2021

CLINICAL DATA: Headache, new or worsening, positional (Age 18-49y)

Patient reports headache and nausea for 3 days.
EXAM:
CT HEAD WITHOUT CONTRAST
TECHNIQUE: Contiguous axial images were obtained from the base of the skull
through the vertex without intravenous contrast.
RADIATION DOSE REDUCTION: This exam was performed according to the
departmental dose-optimization program which includes automated
exposure control, adjustment of the mA and/or kV according to
patient size and/or use of iterative reconstruction technique.

[Series 2: head wo · axial · 0.46mm/px · z∈[-142,-12]mm · 7 of 36 slices shown, 9 images]
[im 5/36  brain]
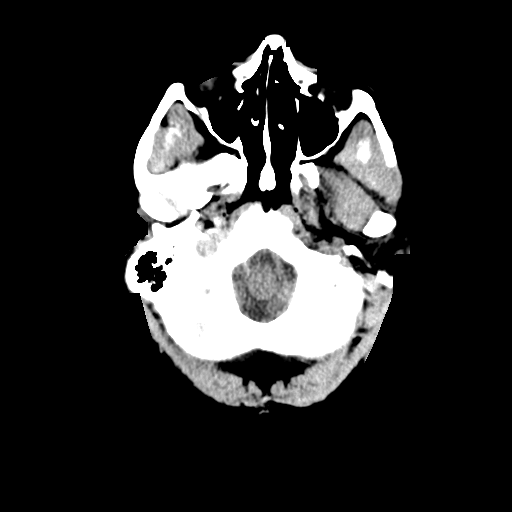
[im 5/36  bone]
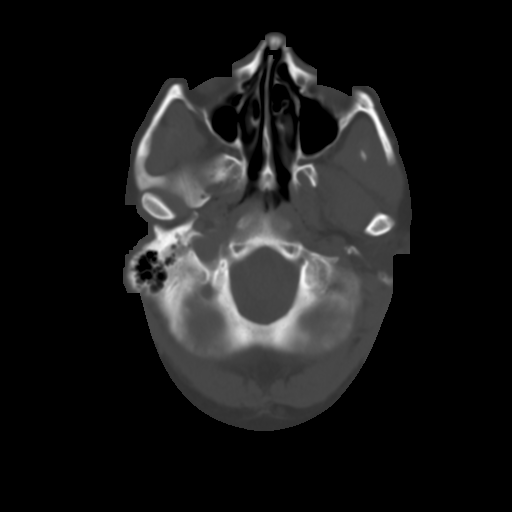
[im 9/36  brain]
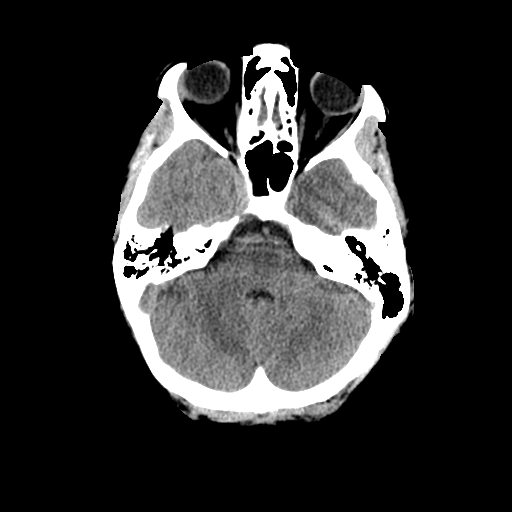
[im 14/36  brain]
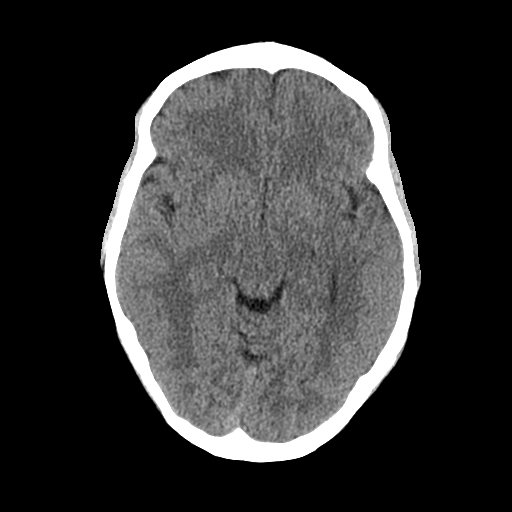
[im 18/36  brain]
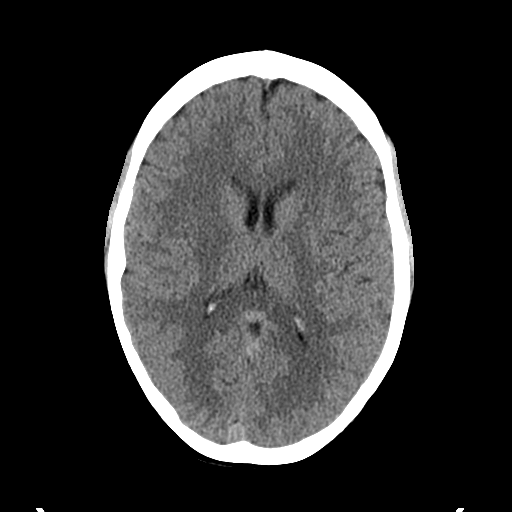
[im 22/36  brain]
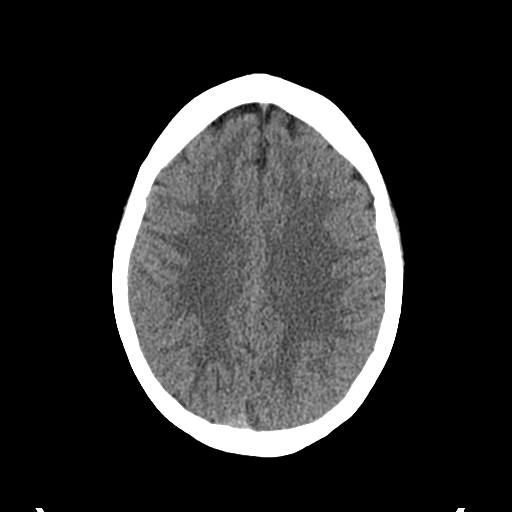
[im 22/36  bone]
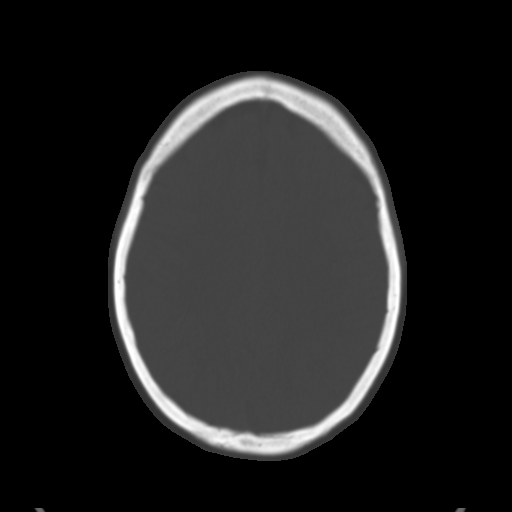
[im 27/36  brain]
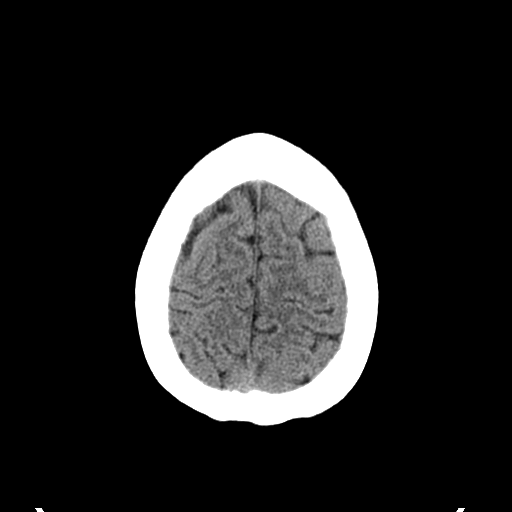
[im 31/36  brain]
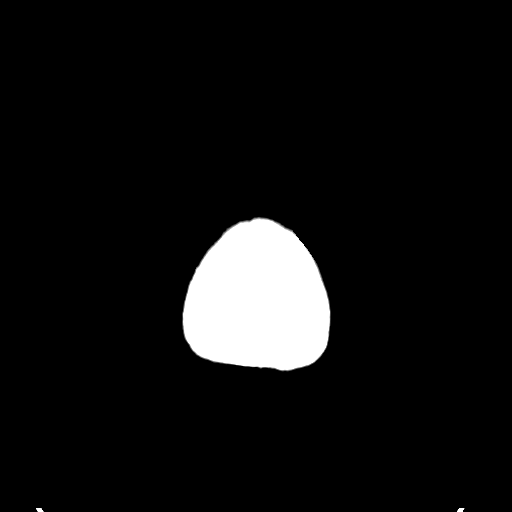

[Series 3: head bone · axial · 0.46mm/px · z∈[-146,-84]mm · 4 of 88 slices shown]
[im 9/88  bone]
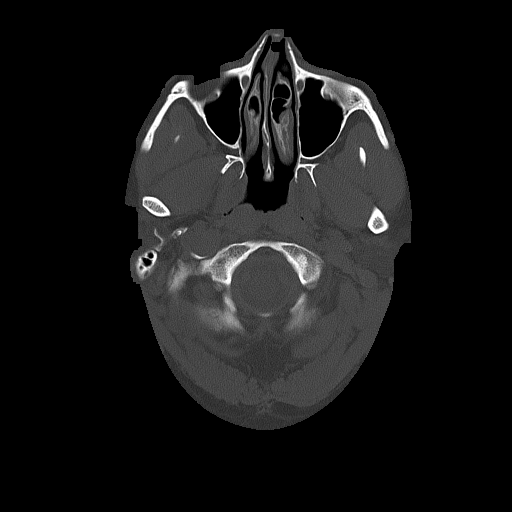
[im 18/88  bone]
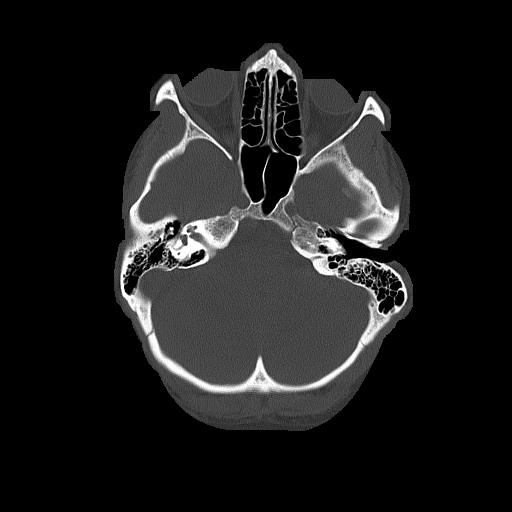
[im 27/88  bone]
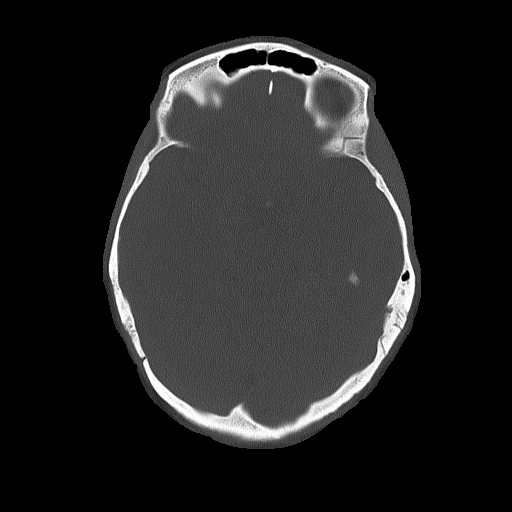
[im 40/88  bone]
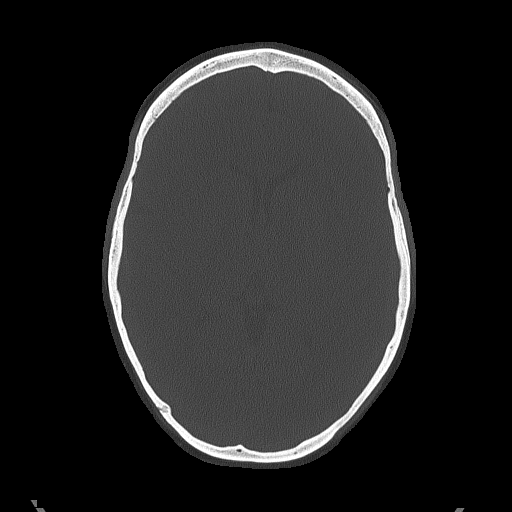

[Series 4: coronal soft tissue · coronal · 0.35mm/px · 3 of 74 slices shown]
[im 25/74  brain]
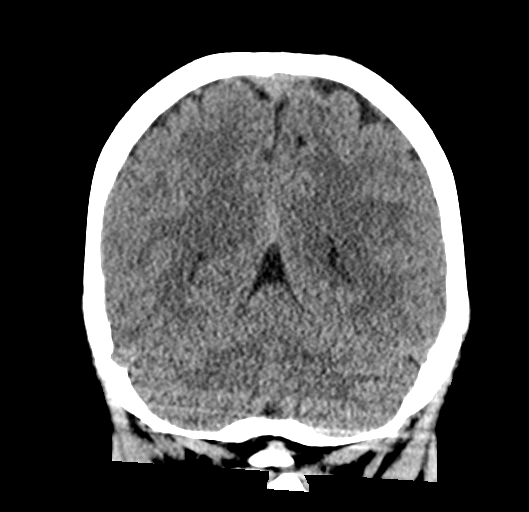
[im 33/74  brain]
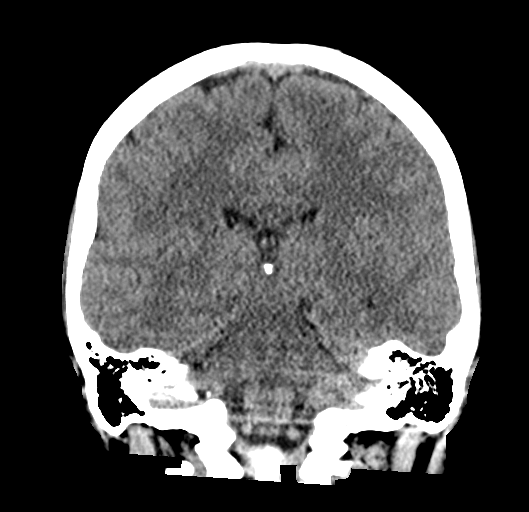
[im 41/74  brain]
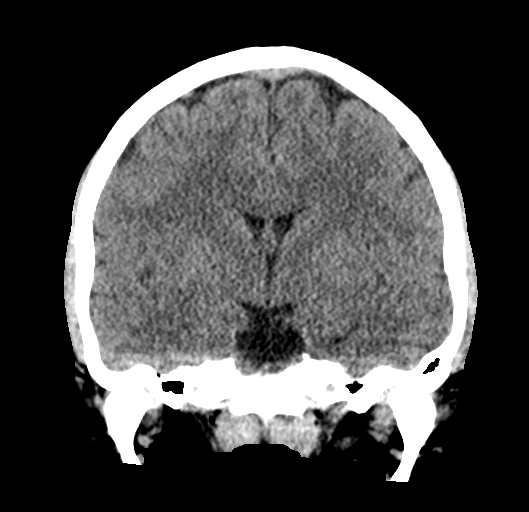

[Series 5: sagittal soft tissue · sagittal · 0.36mm/px · 3 of 59 slices shown]
[im 20/59  brain]
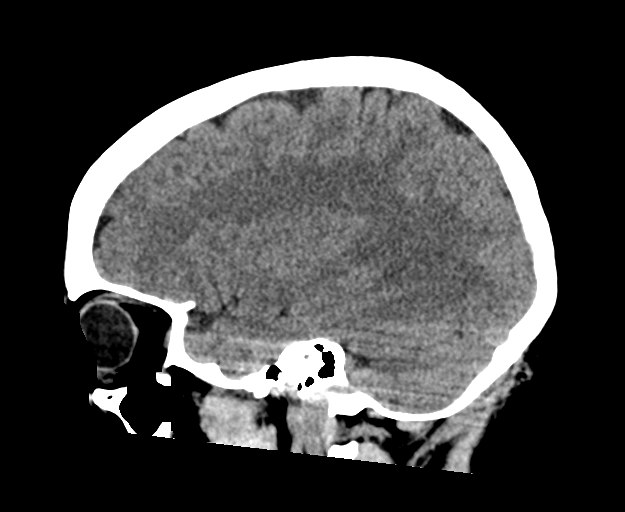
[im 30/59  brain]
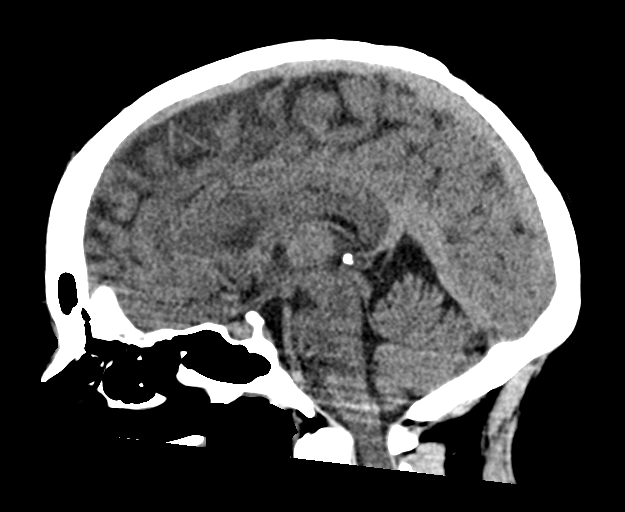
[im 39/59  brain]
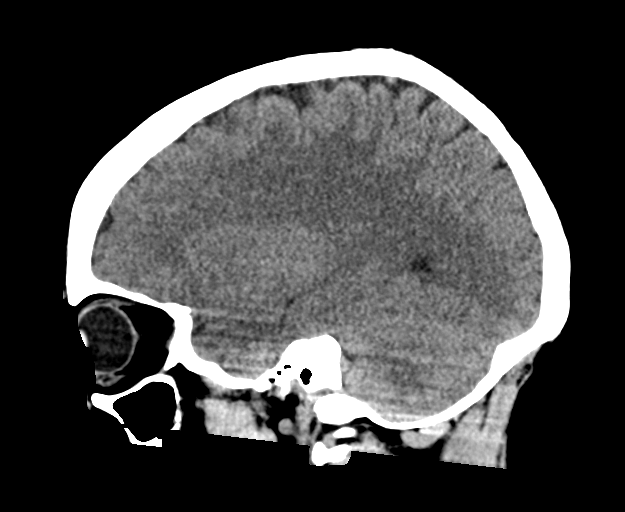

[17 of 47 positions shown; findings below may reference images not displayed]

FINDINGS: Brain: No intracranial hemorrhage, mass effect, or midline shift. No
CT correlate for the small T2 hyperintensities on prior MRI. No
hydrocephalus. The basilar cisterns are patent. No evidence of
territorial infarct or acute ischemia. No extra-axial or
intracranial fluid collection.

Vascular: No hyperdense vessel or unexpected calcification.

Skull: Normal. Negative for fracture or focal lesion.

Sinuses/Orbits: Tiny mucous retention cyst in the left maxillary
sinus. Otherwise negative.

Other: None.
IMPRESSION: No acute intracranial abnormality.

## 2023-06-08 IMAGING — XA IR FLUORO GUIDE SPINAL/SI JT INJ*L*
1 series · 13 of 13 positions shown · non-contrast
Comparison: none

CLINICAL DATA: 30-year-old female with history of demyelinating
disease status post fluoroscopic guided lumbar puncture at L2-L3 and
L3-L4 on 01/28/2022 presenting with persistent headaches in the
setting of presumed intracranial hypotension.

[Series 1: interv standard · 4 acquisitions, 13 frames shown]
[im 1/4]
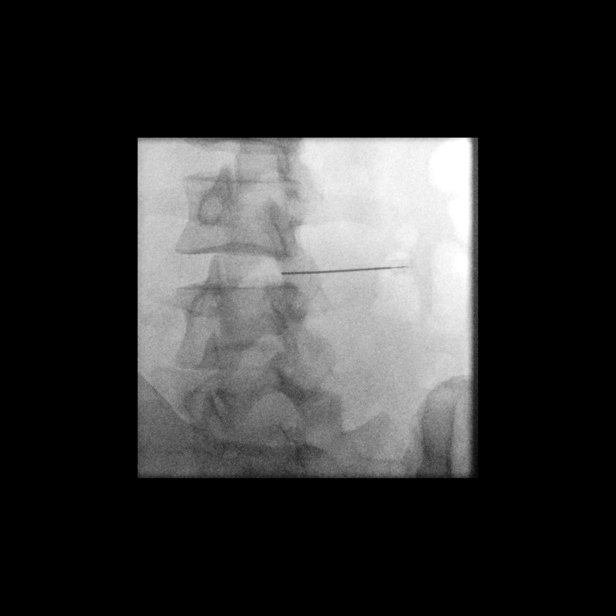
[im 2/4]
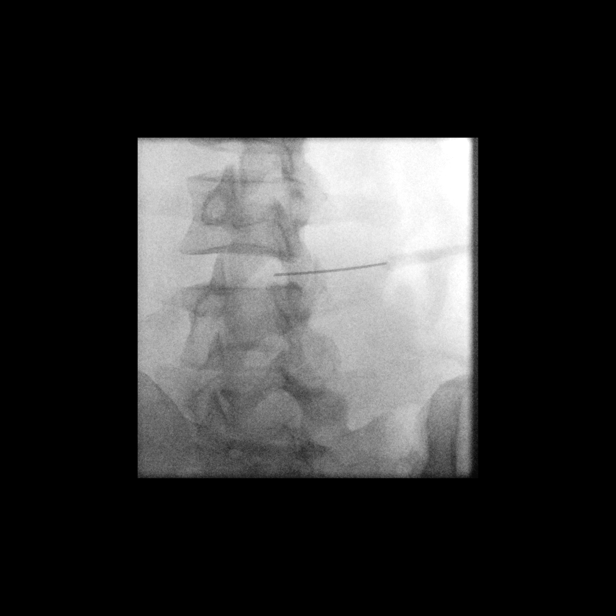
[im 2/4]
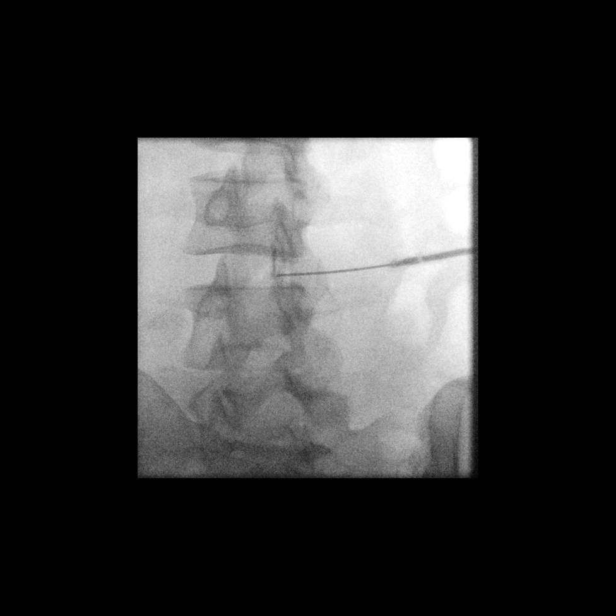
[im 2/4]
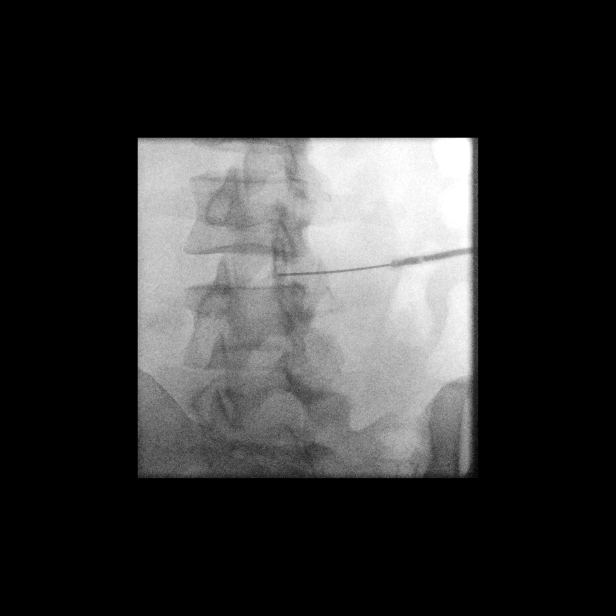
[im 2/4]
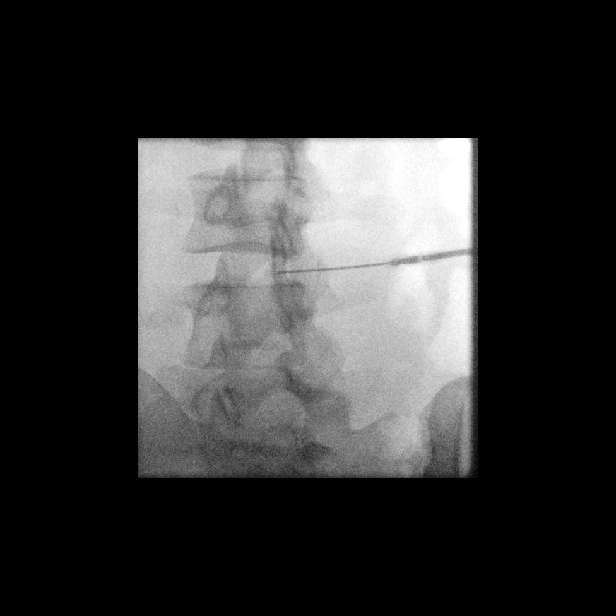
[im 3/4]
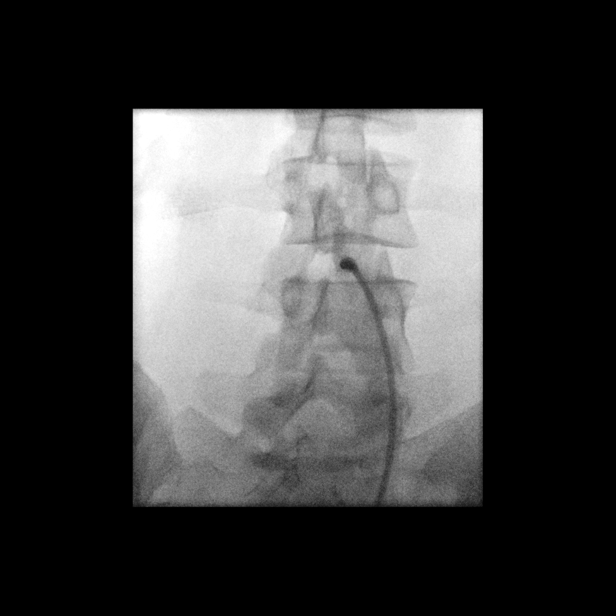
[im 3/4]
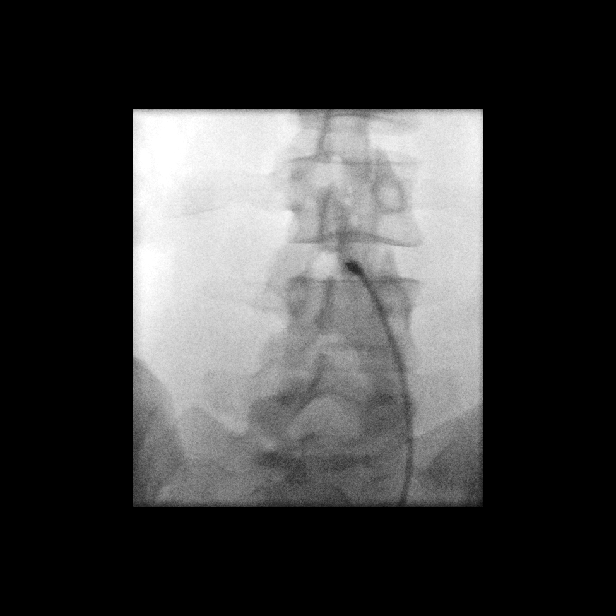
[im 3/4]
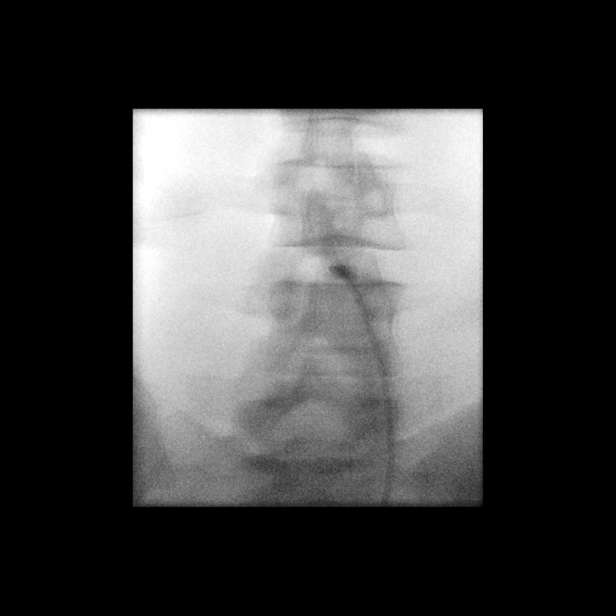
[im 3/4]
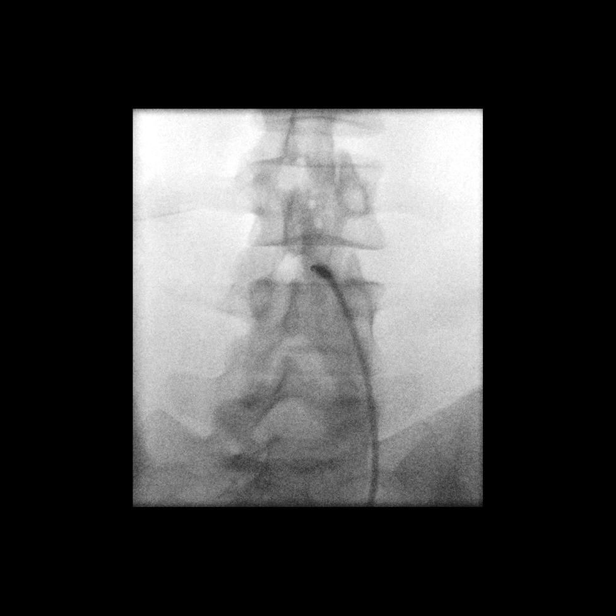
[im 4/4]
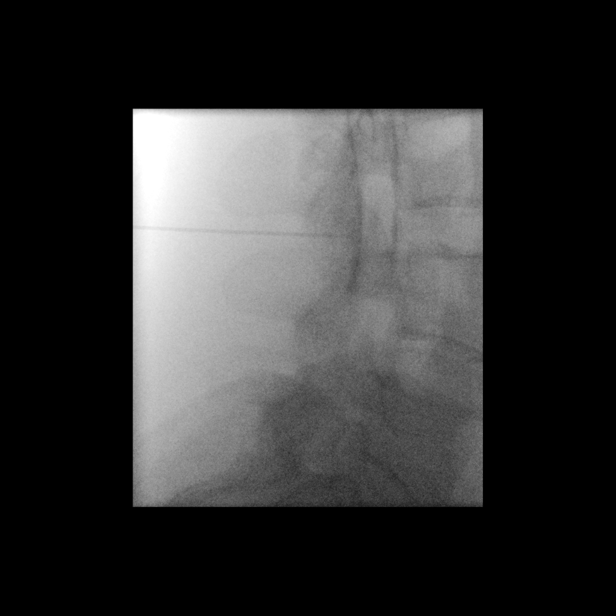
[im 4/4]
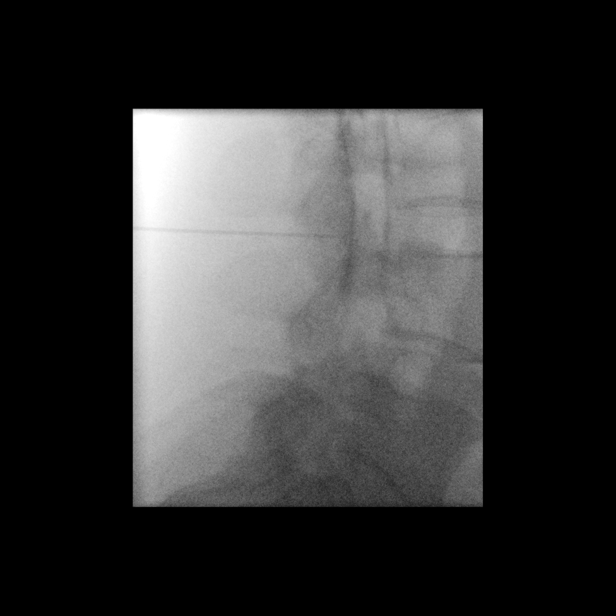
[im 4/4]
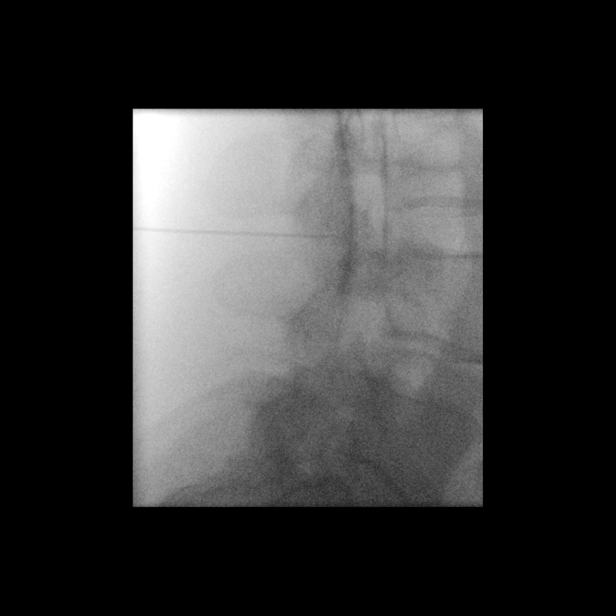
[im 4/4]
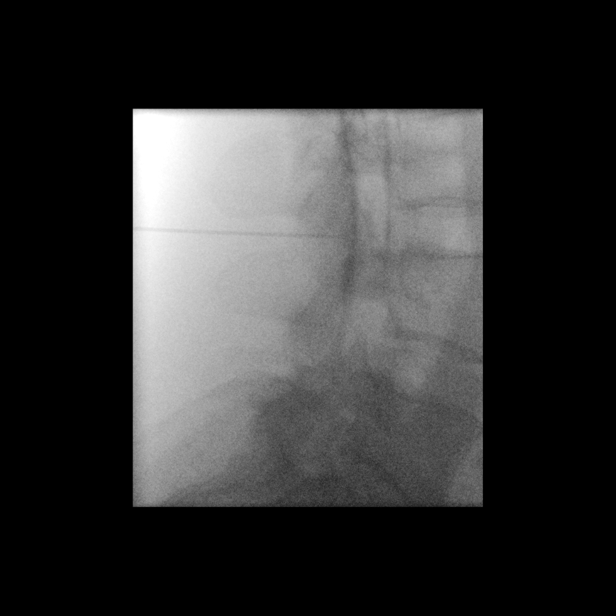

[13 of 13 positions shown; findings below may reference images not displayed]

EXAM:
Fluoroscopic guided epidural autologous blood patch.

PROCEDURE:
The procedure, risks, benefits, and alternatives were explained to
the patient. Questions regarding the procedure were encouraged and
answered. The patient understands and consents to the procedure.

LUMBAR EPIDURAL BLOOD PATCH: 20 ml of blood were withdrawn from the
patient's antecubital fossa. An epidural approach was taken on the
right at L3-L4 using a 3.5 inch 20 gauge epidural needle. Epidural
positioning was confirmed by injecting a small amount Omnipaque 180.
There was no vascular communication. Twenty ml of the patient's
blood was slowly injected into the epidural space in this location.
The procedure was well-tolerated and she was discharged in good
condition with instructions to lie down for additional day.
IMPRESSION: Lumbar epidural blood patch on the right Eviol3-L4.

## 2023-12-14 NOTE — L&D Delivery Note (Signed)
 [facility] Delivery Note  Claire Rivas is a 33 y.o. G3P1011 s/p SVD in the tub at [redacted]w[redacted]d. She was admitted for non-reassuring NST in office with subsequent very normal fetal tracing.  ROM: 18h 54m with clear fluid GBS Status:  Negative/-- (11/13 1634) Maximum Maternal Temperature: 98.3  Labor Progress: Initial SVE: 3/50/-2. She was given misoprostol  and AROM to progress her labor. She was found to be 6cm and got in the tub for about 1 hour but then did not progress. She then came out of the tub and noted to be 6cm but LOP fetal position. She was encouraged and helped with multiple rotational positions to help encouraged movement from the LOP position. She was checked at 12:02 and was still 6cm She expressed a desire for fentanyl  and pitocin was discussed. She received the fentanyl  and was found to be 9.5cm at 1300. Patient was monitored and slept for 1 hour. She then progressed to complete and asked to get into the waterbirth tub which was done. Patient pushed twice in the tub through two contractions.  Delivery Date/Time: 11/22/2024 14:04 Delivery: Samaiyah pushed through two contractions in the waterbirth tub. Head delivered ROA. No nuchal cord present. Claire Rivas was allowed to restitute and with the next push the infant was delivered into the water.  Shoulder and body delivered in usual fashion. Infant was kept under the water and Claire Rivas reached down to grasp her daughter.  Once Claire Rivas was brought above the water, she had a spontaneous cry, placed on mother's abdomen, dried and stimulated.  Mother was given time to transition and once cord was flattened she transitioned to the awaiting bed for delivery of the placenta.   After the cord stopped pulsing, cord clamped x 2 and cut by Father (Claire Rivas). Cord blood collected. Placenta delivered spontaneously with gentle cord traction. Fundus firm with massage and Pitocin. Labia, perineum, vagina, and cervix inspected and were without  laceration Baby Weight: pending  Placenta: 3 vessel, intact. Home with family, they provided a receptacle/cooler Complications: None Lacerations: None EBL: 58 mL Analgesia: Fentanyl  at    Infant:  APGAR (1 MIN):  8 APGAR (5 MINS):  9  Suzen Maryan Masters, MD [facility] [employer] 11/22/2024, 2:32 PM

## 2024-04-09 DIAGNOSIS — O0992 Supervision of high risk pregnancy, unspecified, second trimester: Secondary | ICD-10-CM | POA: Insufficient documentation

## 2024-05-02 DIAGNOSIS — O99210 Obesity complicating pregnancy, unspecified trimester: Secondary | ICD-10-CM | POA: Insufficient documentation

## 2024-05-02 DIAGNOSIS — J4520 Mild intermittent asthma, uncomplicated: Secondary | ICD-10-CM | POA: Insufficient documentation

## 2024-05-30 DIAGNOSIS — O99810 Abnormal glucose complicating pregnancy: Secondary | ICD-10-CM | POA: Insufficient documentation

## 2024-05-30 DIAGNOSIS — Z8759 Personal history of other complications of pregnancy, childbirth and the puerperium: Secondary | ICD-10-CM | POA: Insufficient documentation

## 2024-08-20 ENCOUNTER — Ambulatory Visit: Admission: EM | Admit: 2024-08-20 | Discharge: 2024-08-20 | Disposition: A | Discharge status: Home / Self Care

## 2024-08-20 DIAGNOSIS — F09 Unspecified mental disorder due to known physiological condition: Secondary | ICD-10-CM | POA: Insufficient documentation

## 2024-08-20 DIAGNOSIS — T7840XA Allergy, unspecified, initial encounter: Secondary | ICD-10-CM | POA: Insufficient documentation

## 2024-08-20 DIAGNOSIS — T7421XA Adult sexual abuse, confirmed, initial encounter: Secondary | ICD-10-CM | POA: Insufficient documentation

## 2024-08-20 DIAGNOSIS — J45909 Unspecified asthma, uncomplicated: Secondary | ICD-10-CM

## 2024-08-20 DIAGNOSIS — U071 COVID-19: Secondary | ICD-10-CM

## 2024-08-20 LAB — POC SOFIA SARS ANTIGEN FIA: SARS Coronavirus 2 Ag: POSITIVE — AB
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
## 2024-08-20 NOTE — ED Triage Notes (Signed)
 Patient reports mucous drainage starting Thursday/Friday night, sore throat by Saturday, now with nausea/vomiting (ran out of nausea medication, it is at the pharmacy, I will pick it up), now cough/congestion/sob, left lower back I have noticed too. Pregnant (6mos). No fever known.

## 2024-08-27 DIAGNOSIS — O219 Vomiting of pregnancy, unspecified: Secondary | ICD-10-CM | POA: Insufficient documentation

## 2024-08-27 DIAGNOSIS — M545 Low back pain, unspecified: Secondary | ICD-10-CM | POA: Insufficient documentation

## 2024-08-27 LAB — HEPATITIS C ANTIBODY: HCV Ab: NEGATIVE

## 2024-08-27 LAB — OB RESULTS CONSOLE RPR: RPR: NONREACTIVE

## 2024-08-27 LAB — OB RESULTS CONSOLE VARICELLA ZOSTER ANTIBODY, IGG: Varicella: IMMUNE

## 2024-08-27 LAB — OB RESULTS CONSOLE RUBELLA ANTIBODY, IGM: Rubella: IMMUNE

## 2024-08-27 LAB — OB RESULTS CONSOLE HEPATITIS B SURFACE ANTIGEN: Hepatitis B Surface Ag: NEGATIVE

## 2024-08-27 LAB — OB RESULTS CONSOLE ANTIBODY SCREEN: Antibody Screen: NEGATIVE

## 2024-08-27 LAB — OB RESULTS CONSOLE HIV ANTIBODY (ROUTINE TESTING): HIV: NONREACTIVE

## 2024-08-27 LAB — OB RESULTS CONSOLE PLATELET COUNT: Platelets: 225

## 2024-08-27 LAB — OB RESULTS CONSOLE HGB/HCT, BLOOD
HCT: 34 (ref 29–41)
Hemoglobin: 11.6

## 2024-09-04 DIAGNOSIS — R21 Rash and other nonspecific skin eruption: Secondary | ICD-10-CM | POA: Insufficient documentation

## 2024-09-18 ENCOUNTER — Encounter: Payer: Self-pay | Admitting: Obstetrics and Gynecology

## 2024-09-18 ENCOUNTER — Ambulatory Visit (INDEPENDENT_AMBULATORY_CARE_PROVIDER_SITE_OTHER): Admitting: Obstetrics and Gynecology

## 2024-09-18 VITALS — BP 116/78 | HR 112 | Wt 214.0 lb

## 2024-09-18 DIAGNOSIS — M5432 Sciatica, left side: Secondary | ICD-10-CM | POA: Diagnosis not present

## 2024-09-18 DIAGNOSIS — B009 Herpesviral infection, unspecified: Secondary | ICD-10-CM | POA: Diagnosis not present

## 2024-09-18 DIAGNOSIS — Z3490 Encounter for supervision of normal pregnancy, unspecified, unspecified trimester: Secondary | ICD-10-CM

## 2024-09-18 DIAGNOSIS — Z8759 Personal history of other complications of pregnancy, childbirth and the puerperium: Secondary | ICD-10-CM

## 2024-09-18 DIAGNOSIS — M549 Dorsalgia, unspecified: Secondary | ICD-10-CM

## 2024-09-18 DIAGNOSIS — Z8659 Personal history of other mental and behavioral disorders: Secondary | ICD-10-CM

## 2024-09-18 DIAGNOSIS — Z3480 Encounter for supervision of other normal pregnancy, unspecified trimester: Secondary | ICD-10-CM

## 2024-09-18 DIAGNOSIS — O219 Vomiting of pregnancy, unspecified: Secondary | ICD-10-CM

## 2024-09-18 DIAGNOSIS — Z3A31 31 weeks gestation of pregnancy: Secondary | ICD-10-CM

## 2024-09-18 NOTE — Progress Notes (Unsigned)
 GAD of 6. Pt declined referral to [facility].   Sharp pains in pelvis last night (scale 6 of 10). Complains of sciatic pain in both legs but usually left side.   Was given referral to physical therapist but pt states the office is too far ([facility]).   Pt requesting disability due to N/V and sciatica pain.

## 2024-09-18 NOTE — Progress Notes (Unsigned)
 INITIAL PRENATAL VISIT  Subjective:   Claire Rivas is being seen today for her first obstetrical visit.  This {is/is not:9024} a planned pregnancy. This {is/is not:9024} a desired pregnancy.  She is at [redacted]w[redacted]d gestation by *** Her obstetrical history is significant for {ob risk factors:10154}. Relationship with FOB: {fob:16621}. Patient {does/does not:19097} intend to breast feed. Pregnancy history fully reviewed.  Patient reports {sx:14538}.  Indications for ASA therapy (per uptodate) One of the following: Previous pregnancy with preeclampsia, especially early onset and with an adverse outcome {yes/no:20286} Multifetal gestation {yes/no:20286} Chronic hypertension {yes/no:20286} Type 1 or 2 diabetes mellitus {yes/no:20286} Chronic kidney disease {yes/no:20286} Autoimmune disease (antiphospholipid syndrome, systemic lupus erythematosus) {yes/no:20286}  Two or more of the following: Nulliparity {yes/no:20286} Obesity (body mass index >30 kg/m2) {yes/no:20286} Family history of preeclampsia in mother or sister {yes/no:20286} Age >=35 years {yes/no:20286} Sociodemographic characteristics (African American race, low socioeconomic level) {yes/no:20286} Personal risk factors (eg, previous pregnancy with low birth weight or small for gestational age infant, previous adverse pregnancy outcome [eg, stillbirth], interval >10 years between pregnancies) {yes/no:20286}  Indications for early GDM screening  First-degree relative with diabetes {yes/no:20286} BMI >30kg/m2 {yes/no:20286} Age > 25 {yes/no:20286} Previous birth of an infant weighing >=4000 g {yes/no:20286} Gestational diabetes mellitus in a previous pregnancy {yes/no:20286} Glycated hemoglobin >=5.7 percent (39 mmol/mol), impaired glucose tolerance, or impaired fasting glucose on previous testing {yes/no:20286} High-risk race/ethnicity (eg, African American, Latino, Native American, [place] American, Pacific Islander)  {yes/no:20286} Previous stillbirth of unknown cause {yes/no:20286} Maternal birthweight > 9 lbs {yes/no:20286} History of cardiovascular disease {yes/no:20286} Hypertension or on therapy for hypertension {yes/no:20286} High-density lipoprotein cholesterol level <35 mg/dL (9.09 mmol/L) and/or a triglyceride level >250 mg/dL (7.17 mmol/L) {bzd/wn:79713} Polycystic ovary syndrome {yes/no:20286} Physical inactivity {yes/no:20286} Other clinical condition associated with insulin resistance (eg, severe obesity, acanthosis nigricans) {yes/no:20286} Current use of glucocorticoids {yes/no:20286}   Early screening tests: FBS, A1C, Random CBG, glucose challenge   Objective:    Obstetric History OB History  Gravida Para Term Preterm AB Living  3 1 1  1 1   SAB IAB Ectopic Multiple Live Births   1   1    # Outcome Date GA Lbr Len/2nd Weight Sex Type Anes PTL Lv  3 Current           2 Term 07/04/19 [redacted]w[redacted]d / 00:19 5 lb 12.1 oz (2.61 kg) M Vag-Spont EPI N LIV  1 IAB 02/2018            Past Medical History:  Diagnosis Date  . Allergy   . Anxiety   . Asthma   . Depression   . Frequent headaches   . GERD (gastroesophageal reflux disease)   . History of chicken pox     Past Surgical History:  Procedure Laterality Date  . IR FL GUIDED LOC OF NEEDLE/CATH TIP FOR SPINAL INJECTION LT  02/01/2022  . WISDOM TOOTH EXTRACTION    . WRIST SURGERY Right     Current Outpatient Medications on File Prior to Visit  Medication Sig Dispense Refill  . BIOTIN PO Take by mouth.    . Doxylamine-Pyridoxine 10-10 MG TBEC 2 Tablets at night.  Add 1 tablet on day 3 in the morning if symptoms continue.  If still symptomatic on day 4 add 1 tablet in the afternoon.    . EPINEPHrine  (EPIPEN  2-PAK) 0.3 mg/0.3 mL IJ SOAJ injection Inject 0.3 mLs (0.3 mg total) into the muscle once. 1 Device 0  . fexofenadine (ALLEGRA) 30 MG/5ML suspension Take 30  mg by mouth.    SABRA omeprazole (PRILOSEC) 10 MG capsule Take 10 mg by  mouth daily.    . sertraline (ZOLOFT) 50 MG tablet Take 50 mg by mouth daily.    SABRA Spacer/Aero-Holding Chambers (EASIVENT) inhaler See admin instructions.    . valACYclovir (VALTREX) 500 MG tablet Take 500 mg by mouth daily as needed.    . albuterol  (PROVENTIL  HFA;VENTOLIN  HFA) 108 (90 BASE) MCG/ACT inhaler Inhale 1 puff into the lungs every 6 (six) hours as needed for wheezing or shortness of breath. (Patient taking differently: Inhale 1 puff into the lungs every 6 (six) hours as needed for wheezing or shortness of breath. Last used: Saturday.) 1 Inhaler 0  . clobetasol (TEMOVATE) 0.05 % external solution Apply topically daily.    SABRA escitalopram (LEXAPRO) 10 MG tablet Take 10 mg by mouth daily.    . Ferrous Sulfate (IRON PO) Take 1 tablet by mouth daily.    SABRA ketoconazole (NIZORAL) 2 % shampoo SHAMPOO 2 TO 3 TIMES WEEKLY TO SCALP    . levonorgestrel (MIRENA) 20 MCG/DAY IUD by Intrauterine route.    . lidocaine  (LIDODERM ) 5 % Place 1 patch onto the skin daily. Remove & Discard patch within 12 hours or as directed by MD 30 patch 0  . metoCLOPramide  (REGLAN ) 10 MG tablet Take 1 tablet (10 mg total) by mouth every 8 (eight) hours as needed for nausea. 30 tablet 0  . metroNIDAZOLE  (METROGEL ) 0.75 % gel Apply topically.    . mupirocin ointment (BACTROBAN) 2 % Apply topically 3 (three) times daily.    SABRA UNABLE TO FIND Med Name: Vitamin B/K    . valACYclovir (VALTREX) 1000 MG tablet Take 1 tablet two times a day for 10 days as needed     No current facility-administered medications on file prior to visit.    Allergies  Allergen Reactions  . Misc. Sulfonamide Containing Compounds Other (See Comments) and Rash    Since infancy  Turned skin red  . Shellfish-Derived Products Anaphylaxis and Swelling  . Sulfa Antibiotics Other (See Comments)    Turned skin red  . Attends Briefs Small Rash and Dermatitis  . Wound Dressing Adhesive Rash and Swelling    BAND-AID ADHESIVE  . Wound Dressings  Dermatitis and Swelling    BAND-AID ADHESIVE  . Chlorhexidine Dermatitis    Hospital CHG wipes, Hospital No Wash Soap  . Shellfish Allergy Swelling  . Latex Rash    Social History:  reports that she has never smoked. She has never used smokeless tobacco. She reports that she does not currently use alcohol. She reports that she does not currently use drugs after having used the following drugs: Marijuana.  Family History  Problem Relation Age of Onset  . Mental illness Mother   . Depression Mother   . Diabetes Father        type 2  . Arthritis Maternal Grandmother   . Hyperlipidemia Maternal Grandmother   . Heart disease Maternal Grandmother   . Alcohol abuse Maternal Grandfather   . Hypertension Maternal Grandfather   . Diabetes Paternal Grandmother   . Cancer Other        lung  . Heart disease Other     The following portions of the patient's history were reviewed and updated as appropriate: allergies, current medications, past family history, past medical history, past social history, past surgical history and problem list.  Review of Systems Review of Systems    Physical Exam:  BP 116/78  Pulse (!) 112   Wt 214 lb (97.1 kg)   LMP  (LMP Unknown)   BMI 35.61 kg/m  CONSTITUTIONAL: Well-developed, well-nourished female in no acute distress.  HENT:  Normocephalic, atraumatic.   EYES: Conjunctivae normal. NECK: Normal range of motion SKIN: Skin is warm and dry. MUSCULOSKELETAL: Normal range of motion NEUROLOGIC: Alert and oriented  PSYCHIATRIC: Normal mood and affect. Normal behavior.  CARDIOVASCULAR: Normal heart rate noted RESPIRATORY: normal effort ABDOMEN: Soft PELVIC:**  Fetal Heart Rate (bpm): 139   Movement: Increased       Assessment:    Pregnancy: G3P1011  There are no diagnoses linked to this encounter.    Plan:     Initial labs drawn. Prenatal vitamins. Problem list reviewed and updated. Reviewed in detail the nature of the practice with  collaborative care between  Genetic screening discussed: NIPS/First trimester screen/Quad/AFP {requests/ordered/declines:14581}. Role of ultrasound in pregnancy discussed; Anatomy US : {requests/ordered/declines:14581}. Follow up in {numbers 0-4:31231} weeks. Weight gain recommendations per IOM guidelines reviewed: underweight/BMI 18.5 or less > 28 - 40 lbs; normal weight/BMI 18.5 - 24.9 > 25 - 35 lbs; overweight/BMI 25 - 29.9 > 15 - 25 lbs; obese/BMI  30 or more > 11 - 20 lbs.  Discussed clinic routines, schedule of care and testing, genetic screening options, involvement of students and residents under the direct supervision of APPs and doctors and presence of female providers. Pt verbalized understanding.   Delores Nidia CROME, FNP 09/18/2024 4:05 PM

## 2024-09-19 ENCOUNTER — Encounter: Payer: Self-pay | Admitting: Physical Therapy

## 2024-09-19 ENCOUNTER — Ambulatory Visit: Attending: Obstetrics and Gynecology | Admitting: Physical Therapy

## 2024-09-19 ENCOUNTER — Other Ambulatory Visit: Payer: Self-pay

## 2024-09-19 DIAGNOSIS — M6281 Muscle weakness (generalized): Secondary | ICD-10-CM | POA: Insufficient documentation

## 2024-09-19 DIAGNOSIS — M5432 Sciatica, left side: Secondary | ICD-10-CM | POA: Insufficient documentation

## 2024-09-19 DIAGNOSIS — R293 Abnormal posture: Secondary | ICD-10-CM | POA: Diagnosis present

## 2024-09-19 DIAGNOSIS — O99891 Other specified diseases and conditions complicating pregnancy: Secondary | ICD-10-CM | POA: Insufficient documentation

## 2024-09-19 DIAGNOSIS — R279 Unspecified lack of coordination: Secondary | ICD-10-CM | POA: Diagnosis present

## 2024-09-19 DIAGNOSIS — M549 Dorsalgia, unspecified: Secondary | ICD-10-CM | POA: Insufficient documentation
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
## 2024-09-21 DIAGNOSIS — Z3480 Encounter for supervision of other normal pregnancy, unspecified trimester: Secondary | ICD-10-CM | POA: Insufficient documentation

## 2024-09-25 ENCOUNTER — Ambulatory Visit: Payer: Self-pay | Admitting: Physical Therapy

## 2024-09-26 ENCOUNTER — Ambulatory Visit: Admitting: Physical Therapy

## 2024-09-26 DIAGNOSIS — M6281 Muscle weakness (generalized): Secondary | ICD-10-CM
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
## 2024-10-02 ENCOUNTER — Ambulatory Visit: Payer: Self-pay | Admitting: Physical Therapy

## 2024-10-02 ENCOUNTER — Encounter: Payer: Self-pay | Admitting: Advanced Practice Midwife

## 2024-10-02 ENCOUNTER — Ambulatory Visit: Admitting: Advanced Practice Midwife

## 2024-10-02 VITALS — BP 114/76 | HR 100 | Wt 218.0 lb

## 2024-10-02 DIAGNOSIS — Z8759 Personal history of other complications of pregnancy, childbirth and the puerperium: Secondary | ICD-10-CM

## 2024-10-02 DIAGNOSIS — M6281 Muscle weakness (generalized): Secondary | ICD-10-CM | POA: Diagnosis not present

## 2024-10-02 DIAGNOSIS — Z3480 Encounter for supervision of other normal pregnancy, unspecified trimester: Secondary | ICD-10-CM

## 2024-10-02 DIAGNOSIS — B009 Herpesviral infection, unspecified: Secondary | ICD-10-CM | POA: Diagnosis not present

## 2024-10-02 DIAGNOSIS — O219 Vomiting of pregnancy, unspecified: Secondary | ICD-10-CM

## 2024-10-02 DIAGNOSIS — R002 Palpitations: Secondary | ICD-10-CM

## 2024-10-02 DIAGNOSIS — Z8659 Personal history of other mental and behavioral disorders: Secondary | ICD-10-CM

## 2024-10-02 DIAGNOSIS — Z3490 Encounter for supervision of normal pregnancy, unspecified, unspecified trimester: Secondary | ICD-10-CM

## 2024-10-02 DIAGNOSIS — R279 Unspecified lack of coordination: Secondary | ICD-10-CM

## 2024-10-02 DIAGNOSIS — Z3A33 33 weeks gestation of pregnancy: Secondary | ICD-10-CM

## 2024-10-02 MED ORDER — METOCLOPRAMIDE HCL 10 MG PO TABS: 10.0000 mg | ORAL_TABLET | Freq: Three times a day (TID) | ORAL | 2 refills | Status: DC | PRN

## 2024-10-02 MED ORDER — DOXYLAMINE-PYRIDOXINE 10-10 MG PO TBEC: DELAYED_RELEASE_TABLET | ORAL | 5 refills | Status: DC

## 2024-10-02 NOTE — Progress Notes (Signed)
 Pt presents for ROB visit. Requesting refill of Diclegis.
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
## 2024-10-03 ENCOUNTER — Ambulatory Visit: Attending: Obstetrics and Gynecology | Admitting: Obstetrics

## 2024-10-03 ENCOUNTER — Other Ambulatory Visit: Payer: Self-pay | Admitting: *Deleted

## 2024-10-03 ENCOUNTER — Ambulatory Visit (HOSPITAL_BASED_OUTPATIENT_CLINIC_OR_DEPARTMENT_OTHER)

## 2024-10-03 VITALS — BP 125/76 | HR 98

## 2024-10-03 DIAGNOSIS — Z3490 Encounter for supervision of normal pregnancy, unspecified, unspecified trimester: Secondary | ICD-10-CM

## 2024-10-03 DIAGNOSIS — Z8759 Personal history of other complications of pregnancy, childbirth and the puerperium: Secondary | ICD-10-CM | POA: Diagnosis not present

## 2024-10-03 DIAGNOSIS — O365930 Maternal care for other known or suspected poor fetal growth, third trimester, not applicable or unspecified: Secondary | ICD-10-CM | POA: Diagnosis not present

## 2024-10-03 DIAGNOSIS — Z3A33 33 weeks gestation of pregnancy: Secondary | ICD-10-CM | POA: Insufficient documentation

## 2024-10-03 DIAGNOSIS — O09293 Supervision of pregnancy with other poor reproductive or obstetric history, third trimester: Secondary | ICD-10-CM

## 2024-10-03 DIAGNOSIS — Z3A31 31 weeks gestation of pregnancy: Secondary | ICD-10-CM

## 2024-10-03 DIAGNOSIS — J45909 Unspecified asthma, uncomplicated: Secondary | ICD-10-CM

## 2024-10-03 DIAGNOSIS — O99213 Obesity complicating pregnancy, third trimester: Secondary | ICD-10-CM

## 2024-10-03 DIAGNOSIS — O99513 Diseases of the respiratory system complicating pregnancy, third trimester: Secondary | ICD-10-CM | POA: Diagnosis not present

## 2024-10-03 DIAGNOSIS — O09299 Supervision of pregnancy with other poor reproductive or obstetric history, unspecified trimester: Secondary | ICD-10-CM

## 2024-10-03 DIAGNOSIS — E669 Obesity, unspecified: Secondary | ICD-10-CM

## 2024-10-03 NOTE — Progress Notes (Signed)
 MFM Consult Note  Claire Rivas is currently at 33 weeks and 6 days.  She was seen for an ultrasound exam as she recently transferred her care for delivery at the [facility].  The patient had been receiving care with the [facility].  She denies any problems in her current pregnancy and has screened negative for gestational diabetes.  Her blood pressure today was 125/76.  She had a cell free DNA test earlier in her pregnancy which indicated a low risk for trisomy 87, 61, and 13. A female fetus is predicted.   Sonographic findings Single intrauterine pregnancy at 33w 6d.  Fetal cardiac activity:  Observed and appears normal. Presentation: Cephalic. Fetal biometry shows the estimated fetal weight of 4 pounds 6 ounces which measures at the 11th percentile. Amniotic fluid volume: Within normal limits.  AFI 16.93 cm.  MVP: 5.33 cm. Placenta: Posterior.  The views of the fetal anatomy were limited today due to her advanced gestational age.  What was visualized today appeared within normal limits.  The patient was informed that anomalies may be missed due to technical limitations. If the fetus is in a suboptimal position or maternal habitus is increased, visualization of the fetus in the maternal uterus may be impaired.  Borderline IUGR The patient was advised that the overall EFW obtained today measures at the 11th percentile for her gestational age.   The patient reports that her first child was delivered at 39 weeks and 6 days weighing only 5 pounds 12 ounces. Fetal movements and fetal breathing movements were noted throughout today's exam.   Doppler studies of the umbilical arteries performed today showed a normal S/D ratio without any signs of absent or reversed end-diastolic flow.  A follow-up growth scan was scheduled in 3 weeks.    Should the overall EFW measure at less than the 10th percentile at her next growth ultrasound, delivery will be recommended at around  38 weeks.    Fetal kick count instructions were reviewed today.    The patient stated that all of her questions were answered today.  A total of 30 minutes was spent counseling and coordinating the care for this patient.  Greater than 50% of the time was spent in direct face-to-face contact.

## 2024-10-05 ENCOUNTER — Encounter: Payer: Self-pay | Admitting: Cardiology

## 2024-10-05 ENCOUNTER — Ambulatory Visit: Admitting: Cardiology

## 2024-10-05 ENCOUNTER — Ambulatory Visit: Attending: Cardiology

## 2024-10-05 VITALS — BP 110/80 | HR 116 | Ht 65.0 in | Wt 221.8 lb

## 2024-10-05 DIAGNOSIS — Z3A34 34 weeks gestation of pregnancy: Secondary | ICD-10-CM | POA: Diagnosis not present

## 2024-10-05 DIAGNOSIS — R002 Palpitations: Secondary | ICD-10-CM

## 2024-10-05 NOTE — Progress Notes (Unsigned)
 Enrolled patient for a 7 day Zio XT monitor to be mailed to patients home.

## 2024-10-05 NOTE — Patient Instructions (Addendum)
 Medication Instructions:  NO CHANGES  Lab Work: NONE TO BE DONE TODAY.  Testing/Procedures: GEOFFRY HEWS- Long Term Monitor Instructions  Your physician has requested you wear a ZIO patch monitor for 7 days.  This is a single patch monitor. [employer] supplies one patch monitor per enrollment. Additional stickers are not available. Please do not apply patch if you will be having a Nuclear Stress Test,  Echocardiogram, Cardiac CT, MRI, or Chest Xray during the period you would be wearing the  monitor. The patch cannot be worn during these tests. You cannot remove and re-apply the  ZIO XT patch monitor.  Your ZIO patch monitor will be mailed 3 day USPS to your address on file. It may take 3-5 days  to receive your monitor after you have been enrolled.  Once you have received your monitor, please review the enclosed instructions. Your monitor  has already been registered assigning a specific monitor serial # to you.  Billing and Patient Assistance Program Information  We have supplied [employer] with any of your insurance information on file for billing purposes. [employer] offers a sliding scale Patient Assistance Program for patients that do not have  insurance, or whose insurance does not completely cover the cost of the ZIO monitor.  You must apply for the Patient Assistance Program to qualify for this discounted rate.  To apply, please call [employer] at [phone], select option 4, select option 2, ask to apply for  Patient Assistance Program. Meredeth will ask your household income, and how many people  are in your household. They will quote your out-of-pocket cost based on that information.  [employer] will also be able to set up a 21-month, interest-free payment plan if needed.  Applying the monitor   Shave hair from upper left chest.  Hold abrader disc by orange tab. Rub abrader in 40 strokes over the upper left chest as  indicated in your monitor instructions.  Clean area with 4 enclosed  alcohol pads. Let dry.  Apply patch as indicated in monitor instructions. Patch will be placed under collarbone on left  side of chest with arrow pointing upward.  Rub patch adhesive wings for 2 minutes. Remove white label marked 1. Remove the white  label marked 2. Rub patch adhesive wings for 2 additional minutes.  While looking in a mirror, press and release button in center of patch. A small green light will  flash 3-4 times. This will be your only indicator that the monitor has been turned on.  Do not shower for the first 24 hours. You may shower after the first 24 hours.  Press the button if you feel a symptom. You will hear a small click. Record Date, Time and  Symptom in the Patient Logbook.  When you are ready to remove the patch, follow instructions on the last 2 pages of Patient  Logbook. Stick patch monitor onto the last page of Patient Logbook.  Place Patient Logbook in the blue and white box. Use locking tab on box and tape box closed  securely. The blue and white box has prepaid postage on it. Please place it in the mailbox as  soon as possible. Your physician should have your test results approximately 7 days after the  monitor has been mailed back to [facility].  Call [facility] Customer Care at [phone] if you have questions regarding  your ZIO XT patch monitor. Call them immediately if you see an orange light blinking on your  monitor.  If your monitor falls  off in less than 4 days, contact our Monitor department at [phone].  If your monitor becomes loose or falls off after 4 days call [employer] at [phone] for  suggestions on securing your monitor   Follow-Up: At [facility], you and your health needs are our priority.  As part of our continuing mission to provide you with exceptional heart care, our providers are all part of one team.  This team includes your primary Cardiologist (physician) and Advanced Practice Providers or APPs  (Physician Assistants and Nurse Practitioners) who all work together to provide you with the care you need, when you need it.  Your next appointment:   AS NEEDED   Provider:   DR. DUB TOBB, DO

## 2024-10-08 NOTE — Progress Notes (Signed)
 Cardio-Obstetrics Clinic  New Evaluation  Date:  10/08/2024   ID:  Claire Rivas, DOB Mar 20, 1991, MRN [id number]  PCP:  Sherial Bail, MD   [facility] Providers Cardiologist:  None  Electrophysiologist:  None       Referring MD: Milly Olam LABOR,*   Chief Complaint:  I am having palpitations  History of Present Illness:    Claire Rivas is a 33 y.o. female [G3P1011] who is being seen today for the evaluation of palpitations at the request of Left[place]-Kirby, Olam LABOR,*.   Medical hx includes anxiety, asthma, GERD and depression.  She is currently [redacted] weeks pregnant and presents to be evaluated for palpitations.  She has been experiencing heart palpitations for the past couple of months, describing them as her heart feeling 'overworked'. The palpitations occur sporadically, with a recent episode at a [employer] store causing her heart to race and requiring her to stop for support. Associated symptoms include dizziness, lightheadedness, tinnitus, shortness of breath, and occasional visual disturbances, such as peripheral vision 'blackness'. These symptoms have worsened during her pregnancy.  Currently eight months pregnant, she notes prolonged heart rate recovery after climbing stairs and difficulty sleeping due to heart racing when lying down. She uses meditation and deep breathing to calm herself. She has not measured her heart rate during these episodes.  In her role as a insurance account manager at [employer], she finds significant physical activity challenging due to her symptoms and has applied for short-term disability.  Prior CV Studies Reviewed: The following studies were reviewed today:   Past Medical History:  Diagnosis Date   Allergy    Anxiety    Asthma    Deliberate self-cutting 07/19/2014   Depression    Frequent headaches    GERD (gastroesophageal reflux disease)    History of chicken pox     Past Surgical History:  Procedure  Laterality Date   IR FL GUIDED LOC OF NEEDLE/CATH TIP FOR SPINAL INJECTION LT  02/01/2022   WISDOM TOOTH EXTRACTION     WRIST SURGERY Right       OB History     Gravida  3   Para  1   Term  1   Preterm      AB  1   Living  1      SAB      IAB  1   Ectopic      Multiple      Live Births  1               Current Medications: Current Meds  Medication Sig   albuterol  (PROVENTIL  HFA;VENTOLIN  HFA) 108 (90 BASE) MCG/ACT inhaler Inhale 1 puff into the lungs every 6 (six) hours as needed for wheezing or shortness of breath.   BIOTIN PO Take by mouth.   Doxylamine-Pyridoxine (DICLEGIS) 10-10 MG TBEC Take 2 tabs at bedtime. If needed, add another tab in the morning. If needed, add another tab in the afternoon, up to 4 tabs/day.   EPINEPHrine  (EPIPEN  2-PAK) 0.3 mg/0.3 mL IJ SOAJ injection Inject 0.3 mLs (0.3 mg total) into the muscle once.   fexofenadine (ALLEGRA) 30 MG/5ML suspension Take 30 mg by mouth.   metoCLOPramide  (REGLAN ) 10 MG tablet Take 1 tablet (10 mg total) by mouth 3 (three) times daily with meals as needed for nausea.   omeprazole (PRILOSEC) 10 MG capsule Take 10 mg by mouth daily.   sertraline (ZOLOFT) 50 MG tablet Take 50 mg by mouth daily.  Spacer/Aero-Holding Chambers (EASIVENT) inhaler See admin instructions.   UNABLE TO FIND Med Name: Vitamin B/K   valACYclovir (VALTREX) 1000 MG tablet Take 1 tablet two times a day for 10 days as needed (Patient taking differently: Take 1,000 mg by mouth.)     Allergies:   Misc. sulfonamide containing compounds, Shellfish protein-containing drug products, Sulfa antibiotics, Attends briefs small, Wound dressing adhesive, Wound dressings, Chlorhexidine, Shellfish allergy, and Latex   Social History   Socioeconomic History   Marital status: Single    Spouse name: Not on file   Number of children: Not on file   Years of education: Not on file   Highest education level: Not on file  Occupational History   Not on  file  Tobacco Use   Smoking status: Never   Smokeless tobacco: Never  Vaping Use   Vaping status: Never Used  Substance and Sexual Activity   Alcohol use: Not Currently    Comment: Occasional drink liquor or wine   Drug use: Not Currently    Types: Marijuana    Comment: IUD   Sexual activity: Yes  Other Topics Concern   Not on file  Social History Narrative   Not on file   Social Drivers of Health   Financial Resource Strain: Low Risk  (05/02/2024)   Received from [facility]   Overall Financial Resource Strain (CARDIA)    Difficulty of Paying Living Expenses: Not hard at all  Food Insecurity: No Food Insecurity (05/02/2024)   [facility]   Hunger Vital Sign    Within the past 12 months, you worried that your food would run out before you got the money to buy more.: Never true    Within the past 12 months, the food you bought just didn't last and you didn't have money to get more.: Never true  Transportation Needs: No Transportation Needs (05/02/2024)   Received from [facility] - Transportation    In the past 12 months, has lack of transportation kept you from medical appointments or from getting medications?: No    Lack of Transportation (Non-Medical): No  Physical Activity: Not on file  Stress: Not on file  Social Connections: Unknown (04/12/2022)   Received from [facility]   Social Network    Social Network: Not on file      Family History  Problem Relation Age of Onset   Mental illness Mother    Depression Mother    Diabetes Father        type 2   Arthritis Maternal Grandmother    Hyperlipidemia Maternal Grandmother    Heart disease Maternal Grandmother    Alcohol abuse Maternal Grandfather    Hypertension Maternal Grandfather    Diabetes Paternal Grandmother    Cancer Other        lung   Heart disease Other       ROS:   Please see the history of present illness.     Palpitations All other systems reviewed and are negative.   Labs/EKG Reviewed:    EKG:   EKG was ordered today.  The ekg ordered today demonstrates sinus tachycardia.  Recent Labs: No results found for requested labs within last 365 days.   Recent Lipid Panel No results found for: CHOL, TRIG, HDL, CHOLHDL, LDLCALC, LDLDIRECT  Physical Exam:    VS:  BP 110/80 (BP Location: Left Arm, Patient Position: Sitting, Cuff Size: Normal)   Pulse (!) 116  Ht 5' 5 (1.651 m)   Wt 221 lb 12.8 oz (100.6 kg)   LMP 02/09/2024   SpO2 97%   BMI 36.91 kg/m     Wt Readings from Last 3 Encounters:  10/05/24 221 lb 12.8 oz (100.6 kg)  10/02/24 218 lb (98.9 kg)  09/18/24 214 lb (97.1 kg)     GEN:  Well nourished, well developed in no acute distress HEENT: Normal NECK: No JVD; No carotid bruits LYMPHATICS: No lymphadenopathy CARDIAC: RRR, no murmurs, rubs, gallops RESPIRATORY:  Clear to auscultation without rales, wheezing or rhonchi  ABDOMEN: Soft, non-tender, non-distended MUSCULOSKELETAL:  No edema; No deformity  SKIN: Warm and dry NEUROLOGIC:  Alert and oriented x 3 PSYCHIATRIC:  Normal affect    Risk Assessment/Risk Calculators:     CARPREG II Risk Prediction Index Score:  1.  The patient's risk for a primary cardiac event is 5%.            ASSESSMENT & PLAN:    Palpitations and tachycardia during pregnancy Intermittent palpitations and tachycardia exacerbated by activity and stress, with dizziness and visual disturbances. More pronounced during pregnancy. - Order 7-day heart monitor to assess for arrhythmias. - Follow up on monitor results for further management. - If normal, manage symptoms conservatively until delivery.   Patient Instructions  Medication Instructions:  NO CHANGES  Lab Work: NONE TO BE DONE TODAY.  Testing/Procedures: GEOFFRY HEWS- Long Term Monitor Instructions  Your physician has requested you wear a ZIO patch monitor for 7 days.   This is a single patch monitor. [employer] supplies one patch monitor per enrollment. Additional stickers are not available. Please do not apply patch if you will be having a Nuclear Stress Test,  Echocardiogram, Cardiac CT, MRI, or Chest Xray during the period you would be wearing the  monitor. The patch cannot be worn during these tests. You cannot remove and re-apply the  ZIO XT patch monitor.  Your ZIO patch monitor will be mailed 3 day USPS to your address on file. It may take 3-5 days  to receive your monitor after you have been enrolled.  Once you have received your monitor, please review the enclosed instructions. Your monitor  has already been registered assigning a specific monitor serial # to you.  Billing and Patient Assistance Program Information  We have supplied [employer] with any of your insurance information on file for billing purposes. [employer] offers a sliding scale Patient Assistance Program for patients that do not have  insurance, or whose insurance does not completely cover the cost of the ZIO monitor.  You must apply for the Patient Assistance Program to qualify for this discounted rate.  To apply, please call [employer] at [phone], select option 4, select option 2, ask to apply for  Patient Assistance Program. Meredeth will ask your household income, and how many people  are in your household. They will quote your out-of-pocket cost based on that information.  [employer] will also be able to set up a 21-month, interest-free payment plan if needed.  Applying the monitor   Shave hair from upper left chest.  Hold abrader disc by orange tab. Rub abrader in 40 strokes over the upper left chest as  indicated in your monitor instructions.  Clean area with 4 enclosed alcohol pads. Let dry.  Apply patch as indicated in monitor instructions. Patch will be placed under collarbone on left  side of chest with arrow pointing upward.  Rub patch adhesive wings for 2 minutes. Remove  white  label marked 1. Remove the white  label marked 2. Rub patch adhesive wings for 2 additional minutes.  While looking in a mirror, press and release button in center of patch. A small green light will  flash 3-4 times. This will be your only indicator that the monitor has been turned on.  Do not shower for the first 24 hours. You may shower after the first 24 hours.  Press the button if you feel a symptom. You will hear a small click. Record Date, Time and  Symptom in the Patient Logbook.  When you are ready to remove the patch, follow instructions on the last 2 pages of Patient  Logbook. Stick patch monitor onto the last page of Patient Logbook.  Place Patient Logbook in the blue and white box. Use locking tab on box and tape box closed  securely. The blue and white box has prepaid postage on it. Please place it in the mailbox as  soon as possible. Your physician should have your test results approximately 7 days after the  monitor has been mailed back to [facility].  Call [facility] Customer Care at [phone] if you have questions regarding  your ZIO XT patch monitor. Call them immediately if you see an orange light blinking on your  monitor.  If your monitor falls off in less than 4 days, contact our Monitor department at [phone].  If your monitor becomes loose or falls off after 4 days call [employer] at [phone] for  suggestions on securing your monitor   Follow-Up: At [facility], you and your health needs are our priority.  As part of our continuing mission to provide you with exceptional heart care, our providers are all part of one team.  This team includes your primary Cardiologist (physician) and Advanced Practice Providers or APPs (Physician Assistants and Nurse Practitioners) who all work together to provide you with the care you need, when you need it.  Your next appointment:   AS NEEDED   Provider:   DR. DUB HUNTSMAN, DO   Dispo:   Return if symptoms worsen or fail to improve.   Medication Adjustments/Labs and Tests Ordered: Current medicines are reviewed at length with the patient today.  Concerns regarding medicines are outlined above.  Tests Ordered: Orders Placed This Encounter  Procedures   LONG TERM MONITOR (3-14 DAYS)   EKG 12-Lead   Medication Changes: No orders of the defined types were placed in this encounter.

## 2024-10-09 ENCOUNTER — Telehealth: Payer: Self-pay

## 2024-10-09 ENCOUNTER — Other Ambulatory Visit: Payer: Self-pay | Admitting: Advanced Practice Midwife

## 2024-10-09 MED ORDER — SERTRALINE HCL 50 MG PO TABS: 50.0000 mg | ORAL_TABLET | Freq: Every day | ORAL | 4 refills | Status: DC

## 2024-10-09 NOTE — Progress Notes (Signed)
 Changed pt Zoloft prescription to 90 day supply r/t insurance request

## 2024-10-09 NOTE — Telephone Encounter (Signed)
 routed

## 2024-10-10 ENCOUNTER — Ambulatory Visit: Payer: Self-pay | Admitting: Physical Therapy

## 2024-10-10 ENCOUNTER — Telehealth: Payer: Self-pay | Admitting: Physical Therapy

## 2024-10-10 ENCOUNTER — Ambulatory Visit: Admitting: Physical Therapy

## 2024-10-10 DIAGNOSIS — M6281 Muscle weakness (generalized): Secondary | ICD-10-CM

## 2024-10-10 DIAGNOSIS — R293 Abnormal posture: Secondary | ICD-10-CM

## 2024-10-10 DIAGNOSIS — R279 Unspecified lack of coordination: Secondary | ICD-10-CM
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
## 2024-10-10 NOTE — Telephone Encounter (Signed)
 PT informed pt of missed appt this morning - pt accidentally forgot about today's visit. PT had opening at 2pm today, so pt accepted this time slot and will be here then.  Celena Domino, PT, DPT 10/10/24 11:12 AM [facility] [address], [address] [place], [place] [address] Phone # [phone] Fax [phone]

## 2024-10-12 ENCOUNTER — Telehealth: Payer: Self-pay | Admitting: Cardiology

## 2024-10-12 NOTE — Telephone Encounter (Signed)
 Spoke with pt who complains of itchiness at monitor site.  Pt states she is sensitive to adhesive.  Pt advised office monitor techs are out of the office today.  Provided [employer] customer service number to patient. And encouraged to contact this afternoon.  Will also forward to monitor techs to follow up with pt on Monday.  Pt verbalizes understanding and thanked [employer] for [employer].

## 2024-10-12 NOTE — Telephone Encounter (Signed)
 Pt stated the adhesive on the monitor she's wearing is causing irritation to her skin and itchy. She like to be advised on what to do. Please advise

## 2024-10-16 ENCOUNTER — Encounter: Payer: Self-pay | Admitting: Advanced Practice Midwife

## 2024-10-16 ENCOUNTER — Ambulatory Visit (INDEPENDENT_AMBULATORY_CARE_PROVIDER_SITE_OTHER): Payer: Self-pay | Admitting: Advanced Practice Midwife

## 2024-10-16 ENCOUNTER — Ambulatory Visit: Payer: Self-pay | Admitting: Physical Therapy

## 2024-10-16 VITALS — BP 124/74 | HR 102 | Wt 223.6 lb

## 2024-10-16 DIAGNOSIS — Z8759 Personal history of other complications of pregnancy, childbirth and the puerperium: Secondary | ICD-10-CM | POA: Diagnosis not present

## 2024-10-16 DIAGNOSIS — Z3480 Encounter for supervision of other normal pregnancy, unspecified trimester: Secondary | ICD-10-CM

## 2024-10-16 DIAGNOSIS — R002 Palpitations: Secondary | ICD-10-CM

## 2024-10-16 DIAGNOSIS — Z3A35 35 weeks gestation of pregnancy: Secondary | ICD-10-CM | POA: Diagnosis not present

## 2024-10-16 DIAGNOSIS — O368130 Decreased fetal movements, third trimester, not applicable or unspecified: Secondary | ICD-10-CM

## 2024-10-16 DIAGNOSIS — O26813 Pregnancy related exhaustion and fatigue, third trimester: Secondary | ICD-10-CM | POA: Diagnosis not present

## 2024-10-16 DIAGNOSIS — Z8659 Personal history of other mental and behavioral disorders: Secondary | ICD-10-CM

## 2024-10-16 DIAGNOSIS — B009 Herpesviral infection, unspecified: Secondary | ICD-10-CM

## 2024-10-16 MED ORDER — VALACYCLOVIR HCL 500 MG PO TABS: 500.0000 mg | ORAL_TABLET | Freq: Two times a day (BID) | ORAL | 1 refills | Status: DC

## 2024-10-16 MED ORDER — SERTRALINE HCL 50 MG PO TABS: 50.0000 mg | ORAL_TABLET | Freq: Every day | ORAL | 4 refills | Status: AC

## 2024-10-16 NOTE — Telephone Encounter (Signed)
 Patient has been able to tolerate the ZIO patch.  Tomorrow will be her last day.  She will remove it tomorrow night.  Discussed OTC products to reduce irritation and itching.  Patient already takes Allegra daily for outside allergies.
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
## 2024-10-16 NOTE — Progress Notes (Signed)
 Pt presents for rob. Pt states that baby moves at night and not much during the day, states that she stays tired.  Pt states that she has sharp pains in her vaginal area. No other questions or concerns at this time. Pt states that she is having trouble getting her sertraline.

## 2024-10-16 NOTE — Addendum Note (Signed)
 Addended by: MARCINE GAINS on: 10/16/2024 03:16 PM   Modules accepted: Orders

## 2024-10-17 ENCOUNTER — Ambulatory Visit: Payer: Self-pay | Admitting: Physical Therapy

## 2024-10-18 ENCOUNTER — Ambulatory Visit: Payer: Self-pay | Admitting: Physical Therapy

## 2024-10-23 ENCOUNTER — Ambulatory Visit

## 2024-10-23 ENCOUNTER — Ambulatory Visit: Attending: Obstetrics and Gynecology | Admitting: Obstetrics

## 2024-10-23 VITALS — BP 121/72

## 2024-10-23 DIAGNOSIS — Z3689 Encounter for other specified antenatal screening: Secondary | ICD-10-CM | POA: Insufficient documentation

## 2024-10-23 DIAGNOSIS — O99513 Diseases of the respiratory system complicating pregnancy, third trimester: Secondary | ICD-10-CM

## 2024-10-23 DIAGNOSIS — O09293 Supervision of pregnancy with other poor reproductive or obstetric history, third trimester: Secondary | ICD-10-CM | POA: Diagnosis not present

## 2024-10-23 DIAGNOSIS — Z3A36 36 weeks gestation of pregnancy: Secondary | ICD-10-CM | POA: Insufficient documentation

## 2024-10-23 DIAGNOSIS — O10013 Pre-existing essential hypertension complicating pregnancy, third trimester: Secondary | ICD-10-CM

## 2024-10-23 DIAGNOSIS — O99213 Obesity complicating pregnancy, third trimester: Secondary | ICD-10-CM | POA: Diagnosis present

## 2024-10-23 DIAGNOSIS — J45909 Unspecified asthma, uncomplicated: Secondary | ICD-10-CM

## 2024-10-23 DIAGNOSIS — Z3A37 37 weeks gestation of pregnancy: Secondary | ICD-10-CM

## 2024-10-23 DIAGNOSIS — O4443 Low lying placenta NOS or without hemorrhage, third trimester: Secondary | ICD-10-CM | POA: Diagnosis not present

## 2024-10-23 DIAGNOSIS — E669 Obesity, unspecified: Secondary | ICD-10-CM

## 2024-10-23 DIAGNOSIS — O09299 Supervision of pregnancy with other poor reproductive or obstetric history, unspecified trimester: Secondary | ICD-10-CM
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
## 2024-10-24 ENCOUNTER — Other Ambulatory Visit: Payer: Self-pay

## 2024-10-24 ENCOUNTER — Ambulatory Visit: Payer: Self-pay | Attending: Obstetrics and Gynecology | Admitting: Physical Therapy

## 2024-10-24 DIAGNOSIS — M6281 Muscle weakness (generalized): Secondary | ICD-10-CM | POA: Insufficient documentation

## 2024-10-24 DIAGNOSIS — R279 Unspecified lack of coordination: Secondary | ICD-10-CM | POA: Diagnosis present

## 2024-10-24 DIAGNOSIS — R002 Palpitations: Secondary | ICD-10-CM | POA: Diagnosis not present

## 2024-10-24 MED ORDER — VALACYCLOVIR HCL 500 MG PO TABS: 500.0000 mg | ORAL_TABLET | Freq: Two times a day (BID) | ORAL | 0 refills | Status: AC
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
## 2024-10-25 ENCOUNTER — Ambulatory Visit (INDEPENDENT_AMBULATORY_CARE_PROVIDER_SITE_OTHER): Admitting: Obstetrics and Gynecology

## 2024-10-25 ENCOUNTER — Other Ambulatory Visit (HOSPITAL_COMMUNITY)
Admission: RE | Admit: 2024-10-25 | Discharge: 2024-10-25 | Disposition: A | Source: Ambulatory Visit | Attending: Obstetrics and Gynecology | Admitting: Obstetrics and Gynecology

## 2024-10-25 VITALS — BP 113/71 | HR 95 | Wt 226.0 lb

## 2024-10-25 DIAGNOSIS — B009 Herpesviral infection, unspecified: Secondary | ICD-10-CM | POA: Diagnosis not present

## 2024-10-25 DIAGNOSIS — Z3483 Encounter for supervision of other normal pregnancy, third trimester: Secondary | ICD-10-CM

## 2024-10-25 DIAGNOSIS — Z3A37 37 weeks gestation of pregnancy: Secondary | ICD-10-CM

## 2024-10-25 DIAGNOSIS — Z3480 Encounter for supervision of other normal pregnancy, unspecified trimester: Secondary | ICD-10-CM

## 2024-10-25 DIAGNOSIS — Z8759 Personal history of other complications of pregnancy, childbirth and the puerperium: Secondary | ICD-10-CM
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
## 2024-10-25 NOTE — Progress Notes (Signed)
ROB GBS 

## 2024-10-25 NOTE — Patient Instructions (Signed)
   Considering Waterbirth? Guide for patients at [facility]) Why consider waterbirth? Gentle birth for babies  Less pain medicine used in labor  May allow for passive descent/less pushing  May reduce perineal tears  More mobility and instinctive maternal position changes  Increased maternal relaxation   Is waterbirth safe? What are the risks of infection, drowning or other complications? Infection:  Very low risk (3.7 % for tub vs 4.8% for bed)  7 in 8000 waterbirths with documented infection  Poorly cleaned equipment most common cause  Slightly lower group B strep transmission rate  Drowning  Maternal:  Very low risk  Related to seizures or fainting  Newborn:  Very low risk. No evidence of increased risk of respiratory problems in multiple large studies  Physiological protection from breathing under water  Avoid underwater birth if there are any fetal complications  Once baby's head is out of the water, keep it out.  Birth complication  Some reports of cord trauma, but risk decreased by bringing baby to surface gradually  No evidence of increased risk of shoulder dystocia. Mothers can usually change positions faster in water than in a bed, possibly aiding the maneuvers to free the shoulder.   There are 2 things you MUST do to have a waterbirth with [facility]: Attend a waterbirth class at [facility] at [facility]   3rd Wednesday of every month from 7-9 pm (virtual during COVID) [employer] at [email or web address] or [email or web address] or by calling [phone] Bring us  the certificate from the class to your prenatal appointment or send via MyChart Meet with a midwife at 36 weeks* to see if you can still plan a waterbirth and to sign the consent.   *We also recommend that you schedule as many of your prenatal visits with a midwife as possible.    Helpful information: You may want to bring a bathing suit top to the hospital  to wear during labor but this is optional.  All other supplies are provided by the hospital. Please arrive at the hospital with signs of active labor, and do not wait at home until late in labor. It takes 45 min- 1 hour for fetal monitoring, and check in to your room to take place, plus transport and filling of the waterbirth tub.    Things that would prevent you from having a waterbirth: Premature, <37wks  Previous cesarean birth  Presence of thick meconium-stained fluid  Multiple gestation (Twins, triplets, etc.)  Uncontrolled diabetes or gestational diabetes requiring medication  Hypertension diagnosed in pregnancy or preexisting hypertension (gestational hypertension, preeclampsia, or chronic hypertension) Fetal growth restriction (your baby measures less than 10th percentile on ultrasound) Heavy vaginal bleeding  Non-reassuring fetal heart rate  Active infection (MRSA, etc.). Group B Strep is NOT a contraindication for waterbirth.  If your labor has to be induced and induction method requires continuous monitoring of the baby's heart rate  Other risks/issues identified by your obstetrical provider   Please remember that birth is unpredictable. Under certain unforeseeable circumstances your provider may advise against giving birth in the tub. These decisions will be made on a case-by-case basis and with the safety of you and your baby as our highest priority.    Updated 03/17/22

## 2024-10-26 LAB — CERVICOVAGINAL ANCILLARY ONLY
Chlamydia: NEGATIVE
Comment: NEGATIVE
Comment: NORMAL
Neisseria Gonorrhea: NEGATIVE

## 2024-10-27 ENCOUNTER — Ambulatory Visit: Admission: EM | Admit: 2024-10-27 | Discharge: 2024-10-27 | Disposition: A | Discharge status: Home / Self Care

## 2024-10-27 ENCOUNTER — Encounter: Payer: Self-pay | Admitting: *Deleted

## 2024-10-27 DIAGNOSIS — J45901 Unspecified asthma with (acute) exacerbation: Secondary | ICD-10-CM

## 2024-10-27 MED ORDER — ALBUTEROL SULFATE (2.5 MG/3ML) 0.083% IN NEBU
2.5000 mg | INHALATION_SOLUTION | Freq: Once | RESPIRATORY_TRACT | Status: AC
  Administered 2024-10-27: 2.5 mg via RESPIRATORY_TRACT
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
## 2024-10-27 NOTE — Discharge Instructions (Signed)
 According to Up To Date (medical database of most uptodate medical recommendation) Albuterol  is the medication of choice for control of asthma during pregnancy, as uncontrolled asthma can cause adverse pregnancy outcomes. Please use inhaler 2 puffs every 4-6 hrs as needed for shortness of breath and wheezing.

## 2024-10-27 NOTE — ED Triage Notes (Signed)
 Cough and Shob x 3-4 nights. She has been taking nyquil, dayquil, and delsym without relief. She states she has asthma but has not used her inhaler. States I was told not to use it if possible. Denies known fever. She is [redacted] weeks pregnant

## 2024-10-29 ENCOUNTER — Ambulatory Visit: Payer: Self-pay | Admitting: Cardiology

## 2024-10-29 LAB — CULTURE, BETA STREP (GROUP B ONLY): Strep Gp B Culture: NEGATIVE

## 2024-10-30 ENCOUNTER — Ambulatory Visit: Payer: Self-pay | Admitting: Physical Therapy

## 2024-10-30 ENCOUNTER — Encounter: Payer: Self-pay | Admitting: Advanced Practice Midwife

## 2024-10-30 DIAGNOSIS — M6281 Muscle weakness (generalized): Secondary | ICD-10-CM

## 2024-10-30 DIAGNOSIS — R279 Unspecified lack of coordination: Secondary | ICD-10-CM
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
## 2024-10-31 ENCOUNTER — Ambulatory Visit: Payer: Self-pay | Admitting: Obstetrics and Gynecology

## 2024-11-01 ENCOUNTER — Encounter: Payer: Self-pay | Admitting: Obstetrics and Gynecology

## 2024-11-02 ENCOUNTER — Ambulatory Visit (INDEPENDENT_AMBULATORY_CARE_PROVIDER_SITE_OTHER): Admitting: Obstetrics

## 2024-11-02 ENCOUNTER — Encounter: Payer: Self-pay | Admitting: Obstetrics

## 2024-11-02 ENCOUNTER — Encounter: Admitting: Obstetrics

## 2024-11-02 VITALS — BP 108/74 | HR 99 | Wt 233.5 lb

## 2024-11-02 DIAGNOSIS — O99213 Obesity complicating pregnancy, third trimester: Secondary | ICD-10-CM | POA: Diagnosis not present

## 2024-11-02 DIAGNOSIS — O99210 Obesity complicating pregnancy, unspecified trimester: Secondary | ICD-10-CM

## 2024-11-02 DIAGNOSIS — Z3480 Encounter for supervision of other normal pregnancy, unspecified trimester: Secondary | ICD-10-CM

## 2024-11-02 NOTE — Progress Notes (Signed)
 Pt presents for rob. Pt has no questions or concerns at this time.
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
## 2024-11-06 ENCOUNTER — Encounter: Payer: Self-pay | Admitting: Obstetrics

## 2024-11-06 ENCOUNTER — Ambulatory Visit (INDEPENDENT_AMBULATORY_CARE_PROVIDER_SITE_OTHER): Admitting: Obstetrics

## 2024-11-06 VITALS — Wt 226.8 lb

## 2024-11-06 DIAGNOSIS — Z8759 Personal history of other complications of pregnancy, childbirth and the puerperium: Secondary | ICD-10-CM | POA: Diagnosis not present

## 2024-11-06 DIAGNOSIS — Z3480 Encounter for supervision of other normal pregnancy, unspecified trimester: Secondary | ICD-10-CM

## 2024-11-06 DIAGNOSIS — O368190 Decreased fetal movements, unspecified trimester, not applicable or unspecified: Secondary | ICD-10-CM
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
## 2024-11-06 NOTE — Progress Notes (Signed)
 NPt presents for rob. Pt reports decreased fetal movement during the day but significant movement at night. Pt was put on the NST on 11/4 for the same report. No other questions or concerns at this time.

## 2024-11-07 ENCOUNTER — Ambulatory Visit: Admitting: Physical Therapy

## 2024-11-10 ENCOUNTER — Inpatient Hospital Stay (HOSPITAL_COMMUNITY)
Admission: AD | Admit: 2024-11-10 | Discharge: 2024-11-10 | Disposition: A | Discharge status: Home / Self Care | Attending: Obstetrics and Gynecology | Admitting: Obstetrics and Gynecology

## 2024-11-10 ENCOUNTER — Other Ambulatory Visit: Payer: Self-pay

## 2024-11-10 ENCOUNTER — Encounter (HOSPITAL_COMMUNITY): Payer: Self-pay | Admitting: Obstetrics and Gynecology

## 2024-11-10 DIAGNOSIS — Z3A39 39 weeks gestation of pregnancy: Secondary | ICD-10-CM | POA: Insufficient documentation

## 2024-11-10 DIAGNOSIS — Z91040 Latex allergy status: Secondary | ICD-10-CM | POA: Diagnosis not present

## 2024-11-10 DIAGNOSIS — O368130 Decreased fetal movements, third trimester, not applicable or unspecified: Secondary | ICD-10-CM | POA: Insufficient documentation

## 2024-11-10 DIAGNOSIS — O471 False labor at or after 37 completed weeks of gestation: Secondary | ICD-10-CM | POA: Insufficient documentation

## 2024-11-10 DIAGNOSIS — Z0371 Encounter for suspected problem with amniotic cavity and membrane ruled out: Secondary | ICD-10-CM

## 2024-11-10 DIAGNOSIS — O368190 Decreased fetal movements, unspecified trimester, not applicable or unspecified: Secondary | ICD-10-CM

## 2024-11-10 NOTE — Discharge Instructions (Signed)

## 2024-11-10 NOTE — MAU Provider Note (Signed)
 History     CSN: [id number]  Arrival date and time: 11/10/24 1323   Event Date/Time   First Provider Initiated Contact with Patient 11/10/24 1403      Chief Complaint  Patient presents with   Decreased Fetal Movement   Contractions   Rupture of Membranes   HPI  Claire Rivas is a 33 y.o. G3P1011 at [redacted]w[redacted]d who presents for evaluation of possible leaking of fluid. Patient reports 2 episodes of dripping yesterday and earlier this week. She does not feel like it is urine. She also reports contractions irregularly but more often today than before.  Patient rates the pain as a 3/10 and has not tried anything for the pain. She denies any vaginal bleeding. Denies any constipation, diarrhea or any urinary complaints. Reports feeling less movement earlier today but normal since her arrival to the hospital.    OB History     Gravida  3   Para  1   Term  1   Preterm      AB  1   Living  1      SAB      IAB  1   Ectopic      Multiple      Live Births  1           Past Medical History:  Diagnosis Date   Allergy    Anxiety    Asthma    Deliberate self-cutting 07/19/2014   Depression    Frequent headaches    GERD (gastroesophageal reflux disease)    History of chicken pox     Past Surgical History:  Procedure Laterality Date   IR FL GUIDED LOC OF NEEDLE/CATH TIP FOR SPINAL INJECTION LT  02/01/2022   WISDOM TOOTH EXTRACTION     WRIST SURGERY Right     Family History  Problem Relation Age of Onset   Mental illness Mother    Depression Mother    Diabetes Father        type 2   Arthritis Maternal Grandmother    Hyperlipidemia Maternal Grandmother    Heart disease Maternal Grandmother    Alcohol abuse Maternal Grandfather    Hypertension Maternal Grandfather    Diabetes Paternal Grandmother    Cancer Other        lung   Heart disease Other     Social History   Tobacco Use   Smoking status: Never   Smokeless tobacco: Never  Vaping Use   Vaping  status: Never Used  Substance Use Topics   Alcohol use: Not Currently    Comment: Occasional drink liquor or wine   Drug use: Not Currently    Types: Marijuana    Comment: last use months ago    Allergies:  Allergies  Allergen Reactions   Misc. Sulfonamide Containing Compounds Other (See Comments) and Rash    Since infancy  Turned skin red   Shellfish Protein-Containing Drug Products Anaphylaxis and Swelling   Sulfa Antibiotics Other (See Comments)    Turned skin red   Attends Briefs Small Rash and Dermatitis   Wound Dressing Adhesive Rash and Swelling    BAND-AID ADHESIVE   Wound Dressings Dermatitis and Swelling    BAND-AID ADHESIVE   Chlorhexidine Dermatitis    Hospital CHG wipes, Hospital No Wash Soap   Shellfish Allergy Swelling   Latex Rash    No medications prior to admission.    Review of Systems  Constitutional: Negative.  Negative for fatigue and fever.  HENT: Negative.    Respiratory: Negative.  Negative for shortness of breath.   Cardiovascular: Negative.  Negative for chest pain.  Gastrointestinal: Negative.  Negative for abdominal pain, constipation, diarrhea, nausea and vomiting.  Genitourinary: Negative.  Negative for dysuria.  Neurological: Negative.  Negative for dizziness and headaches.   Physical Exam   Blood pressure 123/81, pulse 95, temperature 97.7 F (36.5 C), temperature source Oral, resp. rate 16, last menstrual period 02/09/2024, SpO2 98%.  Patient Vitals for the past 24 hrs:  BP Temp Temp src Pulse Resp SpO2  11/10/24 1436 123/81 97.7 F (36.5 C) Oral 95 16 98 %  11/10/24 1334 120/77 -- -- (!) 106 -- --    Physical Exam Vitals and nursing note reviewed.  Constitutional:      General: She is not in acute distress.    Appearance: She is well-developed.  HENT:     Head: Normocephalic.  Eyes:     Pupils: Pupils are equal, round, and reactive to light.  Cardiovascular:     Rate and Rhythm: Normal rate and regular rhythm.      Heart sounds: Normal heart sounds.  Pulmonary:     Effort: Pulmonary effort is normal. No respiratory distress.     Breath sounds: Normal breath sounds.  Abdominal:     General: Bowel sounds are normal. There is no distension.     Palpations: Abdomen is soft.     Tenderness: There is no abdominal tenderness.  Genitourinary:    Comments: Pelvic exam: Cervix pink, visually closed, without lesion, scant white creamy discharge, vaginal walls and external genitalia normal, no pooling noted   Skin:    General: Skin is warm and dry.  Neurological:     Mental Status: She is alert and oriented to person, place, and time.  Psychiatric:        Mood and Affect: Mood normal.        Behavior: Behavior normal.        Thought Content: Thought content normal.        Judgment: Judgment normal.     Fetal Tracing:  Baseline: 130 Variability: moderate Accels: 15x15 Decels: none  Toco: none  Dilation: 2 Effacement (%): Thick Cervical Position: Posterior Station: -2 Presentation: Vertex Exam by:: Aleck Fireman, CNM   MAU Course  Procedures  MDM Labs ordered and reviewed.   Negative Fern No Pooling on exam Normal fetal movement No contractions or signs of labor in MAU  Patient reports feeling reassured.   Assessment and Plan   1. Encounter for suspected PROM, with rupture of membranes not found   2. [redacted] weeks gestation of pregnancy     -Discharge home in stable condition -Labor precautions discussed -Patient advised to follow-up with OB as scheduled for prenatal care -Patient may return to MAU as needed or if her condition were to change or worsen  Aleck CHRISTELLA Fireman, CNM 11/10/2024, 2:03 PM

## 2024-11-13 ENCOUNTER — Encounter: Payer: Self-pay | Admitting: Obstetrics and Gynecology

## 2024-11-13 ENCOUNTER — Ambulatory Visit: Admitting: Obstetrics and Gynecology

## 2024-11-13 VITALS — BP 110/74 | HR 93 | Wt 228.1 lb

## 2024-11-13 DIAGNOSIS — Z3480 Encounter for supervision of other normal pregnancy, unspecified trimester: Secondary | ICD-10-CM | POA: Diagnosis not present

## 2024-11-13 DIAGNOSIS — B009 Herpesviral infection, unspecified: Secondary | ICD-10-CM | POA: Diagnosis not present

## 2024-11-13 DIAGNOSIS — Z8759 Personal history of other complications of pregnancy, childbirth and the puerperium: Secondary | ICD-10-CM
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
## 2024-11-13 NOTE — Progress Notes (Signed)
 Woke with headache this morning. Hasn't taken anything for it. Pt reports its better after eating. Pt denies dizziness or vision changes.    Went to MAU on Saturday for leaking clear fluid. Was told she was 2 cm dilated.   Would like cervix check today

## 2024-11-15 ENCOUNTER — Inpatient Hospital Stay (HOSPITAL_COMMUNITY)
Admission: AD | Admit: 2024-11-15 | Discharge: 2024-11-15 | Disposition: A | Discharge status: Home / Self Care | Attending: Obstetrics & Gynecology | Admitting: Obstetrics & Gynecology

## 2024-11-15 ENCOUNTER — Telehealth (HOSPITAL_COMMUNITY): Payer: Self-pay | Admitting: *Deleted

## 2024-11-15 ENCOUNTER — Encounter (HOSPITAL_COMMUNITY): Payer: Self-pay | Admitting: *Deleted

## 2024-11-15 ENCOUNTER — Encounter (HOSPITAL_COMMUNITY): Payer: Self-pay | Admitting: Obstetrics & Gynecology

## 2024-11-15 ENCOUNTER — Other Ambulatory Visit: Payer: Self-pay

## 2024-11-15 DIAGNOSIS — Z3490 Encounter for supervision of normal pregnancy, unspecified, unspecified trimester: Secondary | ICD-10-CM

## 2024-11-15 DIAGNOSIS — Z3A40 40 weeks gestation of pregnancy: Secondary | ICD-10-CM

## 2024-11-15 DIAGNOSIS — Z3493 Encounter for supervision of normal pregnancy, unspecified, third trimester: Secondary | ICD-10-CM

## 2024-11-15 LAB — RUPTURE OF MEMBRANE (ROM)PLUS: Rom Plus: NEGATIVE

## 2024-11-15 LAB — POCT FERN TEST: POCT Fern Test: NEGATIVE

## 2024-11-15 NOTE — MAU Note (Signed)
 Claire Rivas is a 33 y.o. at [redacted]w[redacted]d here in MAU reporting: she thinks her water broke last night at midnight, reports was leaking fluid but hasn't continued.  Denies VB.  Reports +FM.  LMP: 02/09/2024 Onset of complaint: last night Pain score: 0 Vitals:   11/15/24 0736  BP: 118/77  Pulse: (!) 116  Resp: 19  Temp: (!) 97.5 F (36.4 C)  SpO2: 99%     FHT: 143 bpm  Lab orders placed from triage: None

## 2024-11-15 NOTE — Telephone Encounter (Signed)
 Preadmission screen

## 2024-11-15 NOTE — MAU Provider Note (Signed)
 History     CSN: [id number]  Arrival date and time: 11/15/24 9277   None     Chief Complaint  Patient presents with   Rupture of Membranes   HPI Patient presenting for eval ration for concern for rupture membranes.  Was bouncing on a ball when she felt a gush of fluid.  Denies any bleeding.  Reports good fetal movement.  Notes that her fetus is head down.  Not having regular contractions.  The gush of fluid happened last night around midnight. OB History     Gravida  3   Para  1   Term  1   Preterm      AB  1   Living  1      SAB      IAB  1   Ectopic      Multiple      Live Births  1           Past Medical History:  Diagnosis Date   Allergy    Anxiety    Asthma    Deliberate self-cutting 07/19/2014   Depression    Frequent headaches    GERD (gastroesophageal reflux disease)    History of chicken pox     Past Surgical History:  Procedure Laterality Date   IR FL GUIDED LOC OF NEEDLE/CATH TIP FOR SPINAL INJECTION LT  02/01/2022   WISDOM TOOTH EXTRACTION     WRIST SURGERY Right     Family History  Problem Relation Age of Onset   Mental illness Mother    Depression Mother    Diabetes Father        type 2   Arthritis Maternal Grandmother    Hyperlipidemia Maternal Grandmother    Heart disease Maternal Grandmother    Alcohol abuse Maternal Grandfather    Hypertension Maternal Grandfather    Diabetes Paternal Grandmother    Cancer Other        lung   Heart disease Other     Social History   Tobacco Use   Smoking status: Never   Smokeless tobacco: Never  Vaping Use   Vaping status: Never Used  Substance Use Topics   Alcohol use: Not Currently    Comment: Occasional drink liquor or wine   Drug use: Not Currently    Types: Marijuana    Comment: last use months ago    Allergies:  Allergies  Allergen Reactions   Misc. Sulfonamide Containing Compounds Other (See Comments) and Rash    Since infancy  Turned skin red   Shellfish  Protein-Containing Drug Products Anaphylaxis and Swelling   Sulfa Antibiotics Other (See Comments)    Turned skin red   Attends Briefs Small Rash and Dermatitis   Wound Dressing Adhesive Rash and Swelling    BAND-AID ADHESIVE   Wound Dressings Dermatitis and Swelling    BAND-AID ADHESIVE   Chlorhexidine Dermatitis    Hospital CHG wipes, Hospital No Wash Soap   Shellfish Allergy Swelling   Latex Rash    Medications Prior to Admission  Medication Sig Dispense Refill Last Dose/Taking   albuterol  (PROVENTIL  HFA;VENTOLIN  HFA) 108 (90 BASE) MCG/ACT inhaler Inhale 1 puff into the lungs every 6 (six) hours as needed for wheezing or shortness of breath. 1 Inhaler 0 Past Month   aspirin 81 MG chewable tablet Chew 81 mg by mouth daily.   11/14/2024   BIOTIN PO Take by mouth.   11/14/2024   Doxylamine -Pyridoxine  (DICLEGIS ) 10-10 MG TBEC Take 2 tabs  at bedtime. If needed, add another tab in the morning. If needed, add another tab in the afternoon, up to 4 tabs/day. 100 tablet 5 11/14/2024   fexofenadine (ALLEGRA) 30 MG/5ML suspension Take 30 mg by mouth.   11/14/2024   omeprazole (PRILOSEC) 10 MG capsule Take 10 mg by mouth daily.   11/15/2024 Morning   Prenatal Vit-Fe Fumarate-FA (MULTIVITAMIN-PRENATAL) 27-0.8 MG TABS tablet Take 1 tablet by mouth daily at 12 noon.   11/14/2024   sertraline  (ZOLOFT ) 50 MG tablet Take 1 tablet (50 mg total) by mouth daily. 90 tablet 4 11/14/2024   valACYclovir  (VALTREX ) 500 MG tablet Take 1 tablet (500 mg total) by mouth 2 (two) times daily. 180 tablet 0 11/14/2024   metoCLOPramide  (REGLAN ) 10 MG tablet Take 1 tablet (10 mg total) by mouth 3 (three) times daily with meals as needed for nausea. (Patient not taking: Reported on 11/13/2024) 30 tablet 2    Spacer/Aero-Holding Chambers (EASIVENT) inhaler See admin instructions. (Patient not taking: Reported on 11/13/2024)      UNABLE TO FIND Med Name: Vitamin B/K (Patient not taking: Reported on 11/13/2024)       Review of Systems   Gastrointestinal:  Negative for abdominal pain.  Genitourinary:  Positive for vaginal discharge. Negative for vaginal bleeding.  All other systems reviewed and are negative.  Physical Exam   Blood pressure 118/77, pulse (!) 116, temperature (!) 97.5 F (36.4 C), temperature source Oral, resp. rate 19, height 5' 5 (1.651 m), weight 104.7 kg, last menstrual period 02/09/2024, SpO2 99%.  Physical Exam Vitals and nursing note reviewed.  Constitutional:      Appearance: Normal appearance.  HENT:     Head: Normocephalic and atraumatic.     Nose: No congestion or rhinorrhea.  Eyes:     Extraocular Movements: Extraocular movements intact.  Cardiovascular:     Rate and Rhythm: Normal rate.  Pulmonary:     Effort: Pulmonary effort is normal.  Abdominal:     Palpations: Abdomen is soft.     Tenderness: There is no abdominal tenderness.  Genitourinary:    Comments: 2-3/80/-1 Musculoskeletal:        General: Normal range of motion.     Cervical back: Normal range of motion.  Skin:    General: Skin is warm.     Capillary Refill: Capillary refill takes less than 2 seconds.  Neurological:     General: No focal deficit present.     Mental Status: She is alert.     Cranial Nerves: No cranial nerve deficit.  Psychiatric:        Mood and Affect: Mood normal.        Behavior: Behavior normal.   NST 120 bpm, moderate variability, accelerations, no decelerations  MAU Course  Procedures  MDM Physical exam ROM plus NST Assessment and Plan  Claire Rivas is a 33 year old G3, P1 at 40 weeks presenting for concern for rupture membranes.  Rupture of membranes Ferning was negative.  ROM plus negative.  SVE 2 to 3 cm dilated, 80% effaced, -1 station.  Patient not having regular contractions.  NST reassuring.  Patient asked if we could strip her membranes which was completed.  Discussed tricked return precautions.  Patient discharged home.  Claire Rivas 11/15/2024, 8:55 AM

## 2024-11-16 ENCOUNTER — Encounter: Payer: Self-pay | Admitting: Advanced Practice Midwife

## 2024-11-17 ENCOUNTER — Other Ambulatory Visit: Payer: Self-pay

## 2024-11-17 ENCOUNTER — Inpatient Hospital Stay (HOSPITAL_COMMUNITY)
Admission: AD | Admit: 2024-11-17 | Discharge: 2024-11-17 | Disposition: A | Discharge status: Home / Self Care | Attending: Obstetrics and Gynecology | Admitting: Obstetrics and Gynecology

## 2024-11-17 ENCOUNTER — Encounter (HOSPITAL_COMMUNITY): Payer: Self-pay | Admitting: Obstetrics and Gynecology

## 2024-11-17 DIAGNOSIS — Z3A40 40 weeks gestation of pregnancy: Secondary | ICD-10-CM | POA: Diagnosis not present

## 2024-11-17 DIAGNOSIS — Z3689 Encounter for other specified antenatal screening: Secondary | ICD-10-CM

## 2024-11-17 DIAGNOSIS — O479 False labor, unspecified: Secondary | ICD-10-CM | POA: Diagnosis not present

## 2024-11-17 NOTE — MAU Note (Signed)
 MAU Labor Evaluation RN Medical Screening Exam: RN Assessment:  .Claire Rivas is a 33 y.o. at [redacted]w[redacted]d here in MAU for evaluation of labor. See RN triage note.   Pain Score: 7  Pain Location: Pelvis  Cervical exam:  Dilation: 2.5 Effacement (%): 30 Station: -3 Presentation: Vertex Exam by:: Duwaine Brain RN  Fetal Monitoring: Baseline Rate (A): 135 bpm Variability: Moderate Accelerations: 15 x 15 Decelerations: None Contraction Frequency (min): occas/irreg  Vitals:   11/17/24 1433 11/17/24 1435  BP: 118/72   Pulse: (!) 105   Resp: 18   Temp:    SpO2:  98%      Medical Decision Making & Provider Communication:  This RN has communicated with: Provider Name/Title: Camie Rote CNM  Communicated with MAU provider SBAR report of labor evaluation. Also reviewed contraction pattern and that non-stress test is reactive.  Contraction Frequency (min): occas/irreg has been documented with minimal cervical change over 1.5 hours not indicating active labor.  Patient denies any other complaints.  Based on this report provider has given order for discharge. A discharge order and diagnosis entered by a provider. Labor discharge precautions reviewed with patient and all questions answered. Patient verbalized understanding.   Plan of Care: Discharge by Provider

## 2024-11-17 NOTE — MAU Note (Incomplete)
 Claire Rivas is a 33 y.o. at [redacted]w[redacted]d here in MAU reporting: pretty sure I'm having contractions. Feeling pressure in lower back and hips. Feels like she can barely walk or sit and standing hurts. Thursday 2.5/50/-3 vertex and had membrane sweep.  Has not been timing contractions. Denies any LOF or VB. Reports +FM  HSV-Valtrex  daily, no recent outbreaks   Onset of complaint: ongoing, worse today  Pain score: 5 Vitals:   11/17/24 1416  BP: 129/84  Pulse: (!) 110  Resp: 18  Temp: 97.7 F (36.5 C)  SpO2: 98%     FHT:135 Lab orders placed from triage:  labor eval

## 2024-11-17 NOTE — Discharge Instructions (Signed)
 Early Labor Tips   Rest when able, especially if your labor starts at night. Side lying positions can feel better than lying on your back  Move around if you aren't able to rest or if your labor starts at night.  Gentle movement, upright or forward-leaning positions can help your baby to be in a good position and put more pressure on your cervix so it can open. Hip circles standing or on a birth ball can help.  Drink water and eat easily digestible snacks. You may also consider an electrolyte drink (like Gatorade) if you are nauseated or do not have an appetite. Distract yourself with a movie, podcasts, walking outside, music, etc.   The [place] is a series of exercises you can do in early labor to encourage your baby into a better position to make progress in labor.  It is available with pictures at themilescircuit.com  Pain management at home  Try any relaxation and breathing techniques you have learned in antenatal classes. Have a massage. Your birth partner could help by rubbing your back. Take acetaminophen (Tylenol) according to the instructions on the packet - it's safe to take in labour. Have a warm bath or shower. 5.  You may use a TENS unit if you have access to one.    Things to Try After 37 weeks to Encourage Labor/Get Ready for Labor:    Try the [employer] at https://glass.com/.com daily to improve baby's position and encourage the onset of labor.  Walk a little and rest a little every day.  Change positions often.  Cervical Ripening: May try one or both Red Raspberry Leaf capsules or tea:  two 300mg  or 400mg  tablets with each meal, 2-3 times a day, or 1-3 cups of tea daily  Potential Side Effects Of Raspberry Leaf:  Most women do not experience any side effects from drinking raspberry leaf tea. However, nausea and loose stools are possible.  Evening Primrose Oil capsules: take 1 capsule by mouth and place one capsule in the vagina every night.    Some of the  potential side effects:  Upset stomach  Loose stools or diarrhea  Headaches  Nausea  Sex can also help the cervix ripen and encourage labor onset.  5. Eating 6-8 dates per day can help soften and even dilate your cervix. You may eat them plain, in smoothies, or in energy balls.  They do contain sugar so eat with caution and balance with protein.

## 2024-11-17 NOTE — MAU Provider Note (Signed)
 Patient was assessed for active labor and managed by nursing staff during this encounter. I have reviewed the chart and agree with the documentation and plan. I have also reviewed the NST for appropriate reactivity.  Fetal Tracing: Baseline: 130 Variability: moderate Accelerations: 15x15  Decelerations: absent Toco: 2-4  Dilation: 2.5 Effacement (%): 30 Station: -3 Presentation: Vertex Exam by:: Duwaine Brain RN   Patient stable for discharge home with labor precautions. Latent labor comfort measures.   Camie Rote, MSN, CNM, RNC-OB Certified Nurse Midwife, [facility] 11/17/2024 3:06 PM

## 2024-11-21 ENCOUNTER — Other Ambulatory Visit: Payer: Self-pay

## 2024-11-21 ENCOUNTER — Encounter (HOSPITAL_COMMUNITY): Payer: Self-pay | Admitting: Obstetrics & Gynecology

## 2024-11-21 ENCOUNTER — Ambulatory Visit: Admitting: Physician Assistant

## 2024-11-21 ENCOUNTER — Inpatient Hospital Stay (HOSPITAL_COMMUNITY)
Admission: AD | Admit: 2024-11-21 | Discharge: 2024-11-24 | DRG: 806 | Disposition: A | Discharge status: Home / Self Care | Attending: Family Medicine | Admitting: Family Medicine

## 2024-11-21 VITALS — BP 116/73 | HR 97 | Wt 228.3 lb

## 2024-11-21 DIAGNOSIS — Z9189 Other specified personal risk factors, not elsewhere classified: Secondary | ICD-10-CM

## 2024-11-21 DIAGNOSIS — B009 Herpesviral infection, unspecified: Secondary | ICD-10-CM | POA: Diagnosis not present

## 2024-11-21 DIAGNOSIS — F32A Depression, unspecified: Secondary | ICD-10-CM | POA: Diagnosis not present

## 2024-11-21 DIAGNOSIS — Z3A40 40 weeks gestation of pregnancy: Secondary | ICD-10-CM

## 2024-11-21 DIAGNOSIS — F419 Anxiety disorder, unspecified: Secondary | ICD-10-CM | POA: Diagnosis present

## 2024-11-21 DIAGNOSIS — Z8249 Family history of ischemic heart disease and other diseases of the circulatory system: Secondary | ICD-10-CM

## 2024-11-21 DIAGNOSIS — F329 Major depressive disorder, single episode, unspecified: Secondary | ICD-10-CM | POA: Diagnosis present

## 2024-11-21 DIAGNOSIS — Z91040 Latex allergy status: Secondary | ICD-10-CM

## 2024-11-21 DIAGNOSIS — Z3A41 41 weeks gestation of pregnancy: Secondary | ICD-10-CM | POA: Diagnosis not present

## 2024-11-21 DIAGNOSIS — Z3480 Encounter for supervision of other normal pregnancy, unspecified trimester: Secondary | ICD-10-CM

## 2024-11-21 DIAGNOSIS — A6000 Herpesviral infection of urogenital system, unspecified: Secondary | ICD-10-CM | POA: Diagnosis present

## 2024-11-21 DIAGNOSIS — Z79899 Other long term (current) drug therapy: Secondary | ICD-10-CM

## 2024-11-21 DIAGNOSIS — J4520 Mild intermittent asthma, uncomplicated: Secondary | ICD-10-CM

## 2024-11-21 DIAGNOSIS — O9832 Other infections with a predominantly sexual mode of transmission complicating childbirth: Secondary | ICD-10-CM | POA: Diagnosis present

## 2024-11-21 DIAGNOSIS — O480 Post-term pregnancy: Secondary | ICD-10-CM | POA: Diagnosis present

## 2024-11-21 DIAGNOSIS — Z8759 Personal history of other complications of pregnancy, childbirth and the puerperium: Secondary | ICD-10-CM

## 2024-11-21 DIAGNOSIS — O368330 Maternal care for abnormalities of the fetal heart rate or rhythm, third trimester, not applicable or unspecified: Secondary | ICD-10-CM | POA: Diagnosis not present

## 2024-11-21 DIAGNOSIS — Z8616 Personal history of COVID-19: Secondary | ICD-10-CM

## 2024-11-21 DIAGNOSIS — O9952 Diseases of the respiratory system complicating childbirth: Secondary | ICD-10-CM | POA: Diagnosis present

## 2024-11-21 DIAGNOSIS — Z7982 Long term (current) use of aspirin: Secondary | ICD-10-CM

## 2024-11-21 DIAGNOSIS — O99344 Other mental disorders complicating childbirth: Secondary | ICD-10-CM | POA: Diagnosis present

## 2024-11-21 DIAGNOSIS — Z833 Family history of diabetes mellitus: Secondary | ICD-10-CM

## 2024-11-21 LAB — CBC
HCT: 34.4 % — ABNORMAL LOW (ref 36.0–46.0)
Hemoglobin: 11.5 g/dL — ABNORMAL LOW (ref 12.0–15.0)
MCH: 30.2 pg (ref 26.0–34.0)
MCHC: 33.4 g/dL (ref 30.0–36.0)
MCV: 90.3 fL (ref 80.0–100.0)
Platelets: 258 K/uL (ref 150–400)
RBC: 3.81 MIL/uL — ABNORMAL LOW (ref 3.87–5.11)
RDW: 14.3 % (ref 11.5–15.5)
WBC: 12.6 K/uL — ABNORMAL HIGH (ref 4.0–10.5)
nRBC: 0 % (ref 0.0–0.2)

## 2024-11-21 LAB — TYPE AND SCREEN
ABO/RH(D): O POS
Antibody Screen: NEGATIVE

## 2024-11-21 MED ORDER — LACTATED RINGERS IV SOLN: INTRAVENOUS | Status: DC

## 2024-11-21 MED ORDER — LIDOCAINE HCL (PF) 1 % IJ SOLN: 30.0000 mL | INTRAMUSCULAR | Status: DC | PRN

## 2024-11-21 MED ORDER — OXYTOCIN-SODIUM CHLORIDE 30-0.9 UT/500ML-% IV SOLN
2.5000 [IU]/h | INTRAVENOUS | Status: DC
  Filled 2024-11-21: qty 500

## 2024-11-21 MED ORDER — ONDANSETRON HCL 4 MG/2ML IJ SOLN
4.0000 mg | Freq: Four times a day (QID) | INTRAMUSCULAR | Status: DC | PRN
  Administered 2024-11-22: 4 mg via INTRAVENOUS
  Filled 2024-11-21: qty 2

## 2024-11-21 MED ORDER — LACTATED RINGERS IV SOLN
500.0000 mL | INTRAVENOUS | Status: DC | PRN
  Administered 2024-11-21: 1000 mL via INTRAVENOUS

## 2024-11-21 MED ORDER — OXYCODONE-ACETAMINOPHEN 5-325 MG PO TABS: 1.0000 | ORAL_TABLET | ORAL | Status: DC | PRN

## 2024-11-21 MED ORDER — OXYTOCIN BOLUS FROM INFUSION
333.0000 mL | Freq: Once | INTRAVENOUS | Status: AC
  Administered 2024-11-22: 333 mL via INTRAVENOUS

## 2024-11-21 MED ORDER — SOD CITRATE-CITRIC ACID 500-334 MG/5ML PO SOLN
30.0000 mL | ORAL | Status: DC | PRN
  Administered 2024-11-22: 30 mL via ORAL
  Filled 2024-11-21: qty 30

## 2024-11-21 MED ORDER — OXYCODONE-ACETAMINOPHEN 5-325 MG PO TABS: 2.0000 | ORAL_TABLET | ORAL | Status: DC | PRN

## 2024-11-21 MED ORDER — MISOPROSTOL 50MCG HALF TABLET
50.0000 ug | ORAL_TABLET | Freq: Once | ORAL | Status: AC
  Administered 2024-11-21: 50 ug via BUCCAL
  Filled 2024-11-21: qty 1

## 2024-11-21 MED ORDER — ACETAMINOPHEN 325 MG PO TABS: 650.0000 mg | ORAL_TABLET | ORAL | Status: DC | PRN

## 2024-11-21 MED ORDER — SERTRALINE HCL 50 MG PO TABS
50.0000 mg | ORAL_TABLET | Freq: Every day | ORAL | Status: DC
  Administered 2024-11-21: 50 mg via ORAL
  Filled 2024-11-21 (×2): qty 1

## 2024-11-21 MED ORDER — FLEET ENEMA RE ENEM: 1.0000 | ENEMA | RECTAL | Status: DC | PRN

## 2024-11-21 NOTE — Progress Notes (Signed)
 Patient ID: RAYETTE MOGG, female   DOB: [birth year], 33 y.o.   MRN: [id number]  S/p cytotec  x 1 dose @ 1430; feeling mostly back pain that becomes more intense periodically  BP 124/71, other VSS EFM currently intermittent tracing 130s Ctx difficult to assess as is feeling mostly in her back Cx 3/50/-2 (unchanged); AROM for clear fluid  IUP@40 .6wks IOL process  Hopeful that with AROM she will progress into active labor If baby's strip remains reactive, plan for waterbirth once in active labor Anticipate vag delivery  Suzen JONETTA Gentry CNM 11/21/2024 8:03 PM

## 2024-11-21 NOTE — MAU Provider Note (Signed)
 Ms. Claire Rivas is a [id number] at [redacted]w[redacted]d sent to MAU from the office for NRNST. She is postterm and scheduled for IOL tomorrow. She was sent to [facility] for IOL today instead. She receives [facility] with [facility]. Her partner is present and contributing to the history taking.   SVE: not done   NST (Reassuring) - FHR: 130 bpm / moderate variability / 10 x 10 accels present / decels absent / TOCO: 1-2 UCs noted with UI noted   Plan: Admit to L&D IOL orders entered previously by scheduling provider Labor team notified of admission RN to call for L&D room assignment  Ala Cart, CNM  11/21/2024 1:19 PM
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
## 2024-11-21 NOTE — MAU Note (Signed)
 Claire Rivas is a 33 y.o. at [redacted]w[redacted]d here in MAU reporting: she was sent from [facility] office for c/o pelvis pain and pressure.  Also FHR was flat during monitoring at office. States was told she would be induced today versus tomorrow.  Denies VB and LOF.  Reports +FM.  LMP: 02/09/2024 Onset of complaint: today Pain score: 7 Vitals:   11/21/24 1253  BP: 125/71  Pulse: (!) 107  Resp: 18  Temp: 97.9 F (36.6 C)  SpO2: 99%     FHT: 135 bpm  Lab orders placed from triage: None

## 2024-11-21 NOTE — Progress Notes (Signed)
 Pt presents for rob pt has no questions or concerns at this time.

## 2024-11-22 ENCOUNTER — Inpatient Hospital Stay (HOSPITAL_COMMUNITY)

## 2024-11-22 ENCOUNTER — Inpatient Hospital Stay (HOSPITAL_COMMUNITY): Admission: RE | Admit: 2024-11-22 | Payer: Self-pay | Source: Home / Self Care | Admitting: Obstetrics and Gynecology

## 2024-11-22 ENCOUNTER — Encounter (HOSPITAL_COMMUNITY): Payer: Self-pay | Admitting: Obstetrics and Gynecology

## 2024-11-22 DIAGNOSIS — Z3A41 41 weeks gestation of pregnancy: Secondary | ICD-10-CM

## 2024-11-22 DIAGNOSIS — O99344 Other mental disorders complicating childbirth: Secondary | ICD-10-CM

## 2024-11-22 DIAGNOSIS — O368330 Maternal care for abnormalities of the fetal heart rate or rhythm, third trimester, not applicable or unspecified: Secondary | ICD-10-CM

## 2024-11-22 DIAGNOSIS — O480 Post-term pregnancy: Secondary | ICD-10-CM

## 2024-11-22 LAB — SYPHILIS: RPR W/REFLEX TO RPR TITER AND TREPONEMAL ANTIBODIES, TRADITIONAL SCREENING AND DIAGNOSIS ALGORITHM: RPR Ser Ql: NONREACTIVE

## 2024-11-22 MED ORDER — BENZOCAINE-MENTHOL 20-0.5 % EX AERO
1.0000 | INHALATION_SPRAY | CUTANEOUS | Status: DC | PRN
  Administered 2024-11-22: 1 via TOPICAL
  Filled 2024-11-22: qty 56

## 2024-11-22 MED ORDER — WITCH HAZEL-GLYCERIN EX PADS: 1.0000 | MEDICATED_PAD | CUTANEOUS | Status: DC | PRN

## 2024-11-22 MED ORDER — IBUPROFEN 600 MG PO TABS
600.0000 mg | ORAL_TABLET | Freq: Once | ORAL | Status: AC
  Administered 2024-11-22: 600 mg via ORAL
  Filled 2024-11-22: qty 1

## 2024-11-22 MED ORDER — ACETAMINOPHEN 325 MG PO TABS
650.0000 mg | ORAL_TABLET | ORAL | Status: DC | PRN
  Filled 2024-11-22: qty 2

## 2024-11-22 MED ORDER — DOCUSATE SODIUM 100 MG PO CAPS
100.0000 mg | ORAL_CAPSULE | Freq: Two times a day (BID) | ORAL | Status: DC
  Administered 2024-11-22 – 2024-11-24 (×4): 100 mg via ORAL
  Filled 2024-11-22 (×4): qty 1

## 2024-11-22 MED ORDER — DIBUCAINE (PERIANAL) 1 % EX OINT: 1.0000 | TOPICAL_OINTMENT | CUTANEOUS | Status: DC | PRN

## 2024-11-22 MED ORDER — ONDANSETRON HCL 4 MG PO TABS: 4.0000 mg | ORAL_TABLET | ORAL | Status: DC | PRN

## 2024-11-22 MED ORDER — FENTANYL CITRATE (PF) 100 MCG/2ML IJ SOLN
50.0000 ug | INTRAMUSCULAR | Status: DC | PRN
  Administered 2024-11-22: 100 ug via INTRAVENOUS
  Filled 2024-11-22: qty 2

## 2024-11-22 MED ORDER — ONDANSETRON HCL 4 MG/2ML IJ SOLN: 4.0000 mg | INTRAMUSCULAR | Status: DC | PRN

## 2024-11-22 MED ORDER — COCONUT OIL OIL
1.0000 | TOPICAL_OIL | Status: DC | PRN
  Administered 2024-11-22: 1 via TOPICAL

## 2024-11-22 MED ORDER — OXYCODONE HCL 5 MG PO TABS: 10.0000 mg | ORAL_TABLET | ORAL | Status: DC | PRN

## 2024-11-22 MED ORDER — DIPHENHYDRAMINE HCL 25 MG PO CAPS: 25.0000 mg | ORAL_CAPSULE | Freq: Four times a day (QID) | ORAL | Status: DC | PRN

## 2024-11-22 MED ORDER — IBUPROFEN 600 MG PO TABS
600.0000 mg | ORAL_TABLET | Freq: Four times a day (QID) | ORAL | Status: DC
  Administered 2024-11-22 – 2024-11-24 (×7): 600 mg via ORAL
  Filled 2024-11-22 (×7): qty 1

## 2024-11-22 MED ORDER — OXYCODONE HCL 5 MG PO TABS: 5.0000 mg | ORAL_TABLET | ORAL | Status: DC | PRN

## 2024-11-22 MED ORDER — SIMETHICONE 80 MG PO CHEW: 80.0000 mg | CHEWABLE_TABLET | ORAL | Status: DC | PRN

## 2024-11-22 MED ORDER — SERTRALINE HCL 50 MG PO TABS
50.0000 mg | ORAL_TABLET | Freq: Every day | ORAL | Status: DC
  Administered 2024-11-22 – 2024-11-23 (×2): 50 mg via ORAL
  Filled 2024-11-22 (×2): qty 1

## 2024-11-22 MED ORDER — TETANUS-DIPHTH-ACELL PERTUSSIS 5-2-15.5 LF-MCG/0.5 IM SUSP: 0.5000 mL | Freq: Once | INTRAMUSCULAR | Status: DC

## 2024-11-22 MED ORDER — PRENATAL MULTIVITAMIN CH
1.0000 | ORAL_TABLET | Freq: Every day | ORAL | Status: DC
  Administered 2024-11-23: 1 via ORAL
  Filled 2024-11-22: qty 1

## 2024-11-22 NOTE — Progress Notes (Signed)
 Patient ID: Claire Rivas, female   DOB: 07-Nov-1991, 33 y.o.   MRN: [id number]  Has been in shower for hydrotherapy; feels like ctx are stronger now  BP 128/72, other VSS FHR 130s with intermittent auscultation, +accels during ctx Ctx palpate q 3-4 mins  IUP@41 .0wks IOL process Early active labor  -Will wait a bit longer until ctx strengthen and active labor is more established prior to getting into tub -Anticipate vag delivery  Claire Rivas CNM 11/22/2024 3:10 AM

## 2024-11-22 NOTE — Progress Notes (Signed)
 Patient ID: Claire Rivas, female   DOB: 1991/12/05, 33 y.o.   MRN: [id number]  Lying flat on bed-- was sleeping. Contractions every 3-4 minutes but some are less strong and others are quite strong.  BP 119/72   Pulse 99   Temp (!) 97.3 F (36.3 C) (Oral)   Resp 17   Ht 5' 5 (1.651 m)   Wt 103.4 kg   LMP 02/09/2024   SpO2 99%   BMI 37.94 kg/m   FHR 120-130s with IA, +accels Ctx q 2-4 mins  Dilation: 6 Effacement (%): 90 Cervical Position: Middle Station: -1, -2 Presentation: Vertex Exam by:: Jakyren Fluegge MD  Fetal head is LOP  IUP@41 .0wks Early active labor Encouraged rotational positions on land vs pitocin + rotational positions with the plan to turn off pit 1 hr after strong and regular contractions. Patient opted for position changes only Reviewed goals of strong q2-3 min contractions    Claire Rivas [facility]  11/22/2024 9:48 AM

## 2024-11-22 NOTE — Progress Notes (Signed)
 Patient ID: Claire Rivas, female   DOB: Mar 28, 1991, 33 y.o.   MRN: [id number]  Lying in bed on side, breathing w ctx; feels like they are getting stronger; would like to try the tub, but is a little nervous about spacing her labor out  BP 114/74, other VSS FHR 120-130s with IA, +accels Ctx q 2-4 mins Cx 6/70/vtx -2/-1 per RN exam at 0541  IUP@41 .0wks Early active labor  Discussion of now being a reasonable time to try the tub; her nurse was notified and she will work on filling it as soon as she can  Suzen JONETTA Gentry CNM 11/22/2024 6:22 AM

## 2024-11-22 NOTE — Discharge Summary (Signed)
 Postpartum Discharge Summary  Date of Service updated***     Patient Name: Claire Rivas DOB: 1991/01/14 MRN: [id number]  Date of admission: 11/21/2024 Delivery date:11/22/2024 Delivering provider: ELDONNA SUZEN OCTAVE Date of discharge: 11/22/2024  Admitting diagnosis: [redacted] weeks gestation of pregnancy [Z3A.40] Intrauterine pregnancy: [redacted]w[redacted]d     Secondary diagnosis:  Principal Problem:   [redacted] weeks gestation of pregnancy Active Problems:   MDD (major depressive disorder)   HSV-2 infection   Supervision of other normal pregnancy, antepartum  Additional problems: ***    Discharge diagnosis: {DX.:23714}                                              Post partum procedures:{Postpartum procedures:23558} Augmentation: AROM and Cytotec  Complications: None  Hospital course: Induction of Labor With Vaginal Delivery   33 y.o. yo G3P1011 at [redacted]w[redacted]d was admitted to the hospital 11/21/2024 for induction of labor.  Indication for induction: non reassuring NST in the office with very normal tracing since admission.  Patient had an labor course complicated by nothing. Induction was smooth and patient had only cytotec  and AROM to progress labor. She delivered in the waterbirth tub.  Membrane Rupture Time/Date: 7:55 PM,11/21/2024  Delivery Method:Vaginal, Spontaneous Operative Delivery:N/A Episiotomy: None Lacerations:  None Details of delivery can be found in separate delivery note.  Patient had a postpartum course complicated by***. Patient is discharged home 11/22/2024.  Newborn Data: Birth date:11/22/2024 Birth time:2:04 PM Gender:Female Living status:Living Apgars:8 ,9  Weight:3180 g  Magnesium  Sulfate received: No BMZ received: No Rhophylac:N/A MMR:N/A T-DaP:{Tdap:23962} Flu: {Qol:76036} RSV Vaccine received: {RSV:31013} Transfusion:{Transfusion received:30440034}  Immunizations received: Immunization History  Administered Date(s) Administered   Influenza,inj,Quad PF,6+  Mos 11/18/2014   Tdap 11/18/2014    Physical exam  Vitals:   11/22/24 1435 11/22/24 1501 11/22/24 1505 11/22/24 1516  BP: 124/88 123/71  116/70  Pulse: 96 (!) 102  (!) 101  Resp:      Temp:   97.9 F (36.6 C)   TempSrc:   Oral   SpO2:      Weight:      Height:       General: {Exam; general:21111117} Lochia: {Desc; appropriate/inappropriate:30686::appropriate} Uterine Fundus: {Desc; firm/soft:30687} Incision: {Exam; incision:21111123} DVT Evaluation: {Exam; dvt:2111122} Labs: Lab Results  Component Value Date   WBC 12.6 (H) 11/21/2024   HGB 11.5 (L) 11/21/2024   HCT 34.4 (L) 11/21/2024   MCV 90.3 11/21/2024   PLT 258 11/21/2024      Latest Ref Rng & Units 02/02/2022   10:49 AM  CMP  Glucose 70 - 99 mg/dL 860   BUN 6 - 20 mg/dL 13   Creatinine 9.55 - 1.00 mg/dL 9.31   Sodium 864 - 854 mmol/L 141   Potassium 3.5 - 5.1 mmol/L 3.3   Chloride 98 - 111 mmol/L 109   CO2 22 - 32 mmol/L 23   Calcium 8.9 - 10.3 mg/dL 8.4    Edinburgh Score:     No data to display         No data recorded  After visit meds:  Allergies as of 11/22/2024       Reactions   Misc. Sulfonamide Containing Compounds Other (See Comments), Rash   Since infancy Turned skin red   Shellfish Protein-containing Drug Products Anaphylaxis, Swelling   Sulfa Antibiotics Other (See Comments)   Turned skin red  Attends Briefs Small Rash, Dermatitis   Wound Dressing Adhesive Rash, Swelling   BAND-AID ADHESIVE   Wound Dressings Dermatitis, Swelling   BAND-AID ADHESIVE   Chlorhexidine Dermatitis   Hospital CHG wipes, Hospital No Wash Soap   Shellfish Allergy Swelling   Latex Rash     Med Rec must be completed prior to using this [facility]***        Discharge home in stable condition Infant Feeding: Breast Infant Disposition:{CHL IP OB HOME WITH FNUYZM:76418} Discharge instruction: per After Visit Summary and Postpartum booklet. Activity: Advance as tolerated. Pelvic rest for 6  weeks.  Diet: {OB ipzu:78888878} Future Appointments: Future Appointments  Date Time Provider Department Center  01/01/2025 10:35 AM Nicholaus Burnard HERO, MD [facility] None   Follow up Visit:  Sent to [facility] 11/22/2024  Please schedule this patient for a In person postpartum visit in 6 weeks with the following provider: CNM. Additional Postpartum F/U:Postpartum Depression checkup  Low risk pregnancy complicated by: Depression Delivery mode:  Vaginal, Spontaneous Anticipated Birth Control:  Condoms, possible Nexplanon. Partner might get vasectomy   11/22/2024 Suzen Maryan Masters, MD

## 2024-11-23 ENCOUNTER — Other Ambulatory Visit (HOSPITAL_COMMUNITY): Payer: Self-pay

## 2024-11-23 ENCOUNTER — Encounter (HOSPITAL_COMMUNITY): Payer: Self-pay | Admitting: Obstetrics and Gynecology

## 2024-11-23 LAB — CBC
HCT: 28.4 % — ABNORMAL LOW (ref 36.0–46.0)
Hemoglobin: 9.6 g/dL — ABNORMAL LOW (ref 12.0–15.0)
MCH: 30.4 pg (ref 26.0–34.0)
MCHC: 33.8 g/dL (ref 30.0–36.0)
MCV: 89.9 fL (ref 80.0–100.0)
Platelets: 196 K/uL (ref 150–400)
RBC: 3.16 MIL/uL — ABNORMAL LOW (ref 3.87–5.11)
RDW: 14.2 % (ref 11.5–15.5)
WBC: 18 K/uL — ABNORMAL HIGH (ref 4.0–10.5)
nRBC: 0 % (ref 0.0–0.2)

## 2024-11-23 MED ORDER — IBUPROFEN 600 MG PO TABS
600.0000 mg | ORAL_TABLET | Freq: Four times a day (QID) | ORAL | 0 refills | Status: AC
  Filled 2024-11-23: qty 30, 8d supply, fill #0

## 2024-11-23 MED ORDER — FERROUS SULFATE 325 (65 FE) MG PO TABS
325.0000 mg | ORAL_TABLET | ORAL | 3 refills | Status: AC
  Filled 2024-11-23: qty 41, 82d supply, fill #0

## 2024-11-23 MED ORDER — ACETAMINOPHEN 325 MG PO TABS
650.0000 mg | ORAL_TABLET | ORAL | 0 refills | Status: AC | PRN
  Filled 2024-11-23: qty 30, 3d supply, fill #0

## 2024-11-23 MED ADMIN — Pantoprazole Sodium EC Tab 20 MG (Base Equiv): 20 mg | ORAL | NDC 50268058511

## 2024-11-23 MED FILL — Pantoprazole Sodium EC Tab 20 MG (Base Equiv): 20.0000 mg | ORAL | Qty: 1 | Status: AC
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
## 2024-11-23 NOTE — Progress Notes (Signed)
 Spoke with RN on floor that baby will now be discharging tomorrow, 12/13. Cancelled d/c order for today. Plan for d/c tomorrow, 12/13.  Anushri Casalino, DO 11/23/2024 4:42 PM

## 2024-11-23 NOTE — Lactation Note (Signed)
 This note was copied from a baby's chart. Lactation Consultation Note  Patient Name: Claire Rivas Date: 11/23/2024 Age:32 hours Reason for consult: Initial assessment;Term;Infant weight loss (infant with weight loss -2.83%) LC worked with positioning and pillow support. MOB switched to cross cradle instead of cradle hold, infant latched with depth, no more dimpling of infant's cheek, LC saw infant transferring colostrum with deep swallows . MOB is experienced with breastfeeding, she BF first child for 3 years. MOB will continue to breastfeed infant by cues, on demand, 8-12 times within 24 hours, skin to skin. MOB will call if [facility] services is needed. LC placed MOB in PRN.   Maternal Data Has patient been taught Hand Expression?: Yes Does the patient have breastfeeding experience prior to this delivery?: Yes How long did the patient breastfeed?: MOB breastfeed her first child for 3 year  Feeding Mother's Current Feeding Choice: Breast Milk  LATCH Score Latch: Grasps breast easily, tongue down, lips flanged, rhythmical sucking.  Audible Swallowing: Spontaneous and intermittent  Type of Nipple: Everted at rest and after stimulation  Comfort (Breast/Nipple): Filling, red/small blisters or bruises, mild/mod discomfort  Hold (Positioning): Assistance needed to correctly position infant at breast and maintain latch.  LATCH Score: 8   Lactation Tools Discussed/Used    Interventions Interventions: Breast feeding basics reviewed;Assisted with latch;Skin to skin;Breast compression;Adjust position;Support pillows;Position options  Discharge Pump: DEBP;Personal  Consult Status Consult Status: Follow-up Date: 11/24/24 Follow-up type: In-patient    Claire Rivas 11/23/2024, 8:49 PM

## 2024-11-23 NOTE — Patient Instructions (Signed)
 If you are interested in an outpatient lactation consultation -- available in-office or virtually -- please reach out to us  at:  [facility] (First Floor) ?? [address], [place], [place]  ?? [phone] Please leave a message on our lactation voicemail box. We welcome any lactation-related questions or concerns -- our team is here to support you and your baby.  Lactation Support Groups Join us  at: [facility] ?? Tuesdays, 10:00 AM - 12:00 PM ?? [address], [place] [employer], [place]  Lactating parents and lap babies are welcome!  ?? [email or web address]  ?? [email or web address] -------------  Si est interesado en una consulta ambulatoria de lactancia, disponible en el consultorio o virtualmente, comunquese con nosotros en:  [facility] ([facility] ?? [address], [place], [place]  ?? [phone] Por favor, deje un mensaje en nuestro buzn de voz de lactancia. Estamos aqu para responder cualquier pregunta o inquietud relacionada con la lactancia y para apoyarle a usted y a su beb.  Grupos de Apoyo para la Lactancia nase a nosotros en: [facility] ?? Martes, de 10:00 a. m. a 12:00 p. m. ?? [address], [place], [place]  Se admiten madres lactantes y bebs en regazo.  ?? [email or web address]  ?? [email or web address]      Claire Rivas, [facility]

## 2024-11-24 ENCOUNTER — Other Ambulatory Visit (HOSPITAL_COMMUNITY): Payer: Self-pay
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
## 2024-11-24 NOTE — Discharge Instructions (Signed)

## 2024-12-01 ENCOUNTER — Telehealth (HOSPITAL_COMMUNITY): Payer: Self-pay

## 2024-12-01 NOTE — Telephone Encounter (Signed)
 12/01/2024 1159  Name: Claire Rivas MRN: [id number] DOB: 1991-03-30  Reason for Call:  Transition of Care Hospital Discharge Call  Contact Status: Patient Contact Status: Complete  Language assistant needed:          Follow-Up Questions: Do You Have Any Concerns About Your Health As You Heal From Delivery?: Yes What Concerns Do You Have About Your Health?: Patient states that she has some discomfort in lower abdomen. RN reviewed uterine involution and cramping. RN reviewed comfort techniques for cramping. Patient has no other questions or concerns at this time. Do You Have Any Concerns About Your Infants Health?: No  Edinburgh Postnatal Depression Scale:  In the Past 7 Days: I have been able to laugh and see the funny side of things.: As much as I always could I have looked forward with enjoyment to things.: As much as I ever did I have blamed myself unnecessarily when things went wrong.: No, never I have been anxious or worried for no good reason.: Hardly ever I have felt scared or panicky for no good reason.: No, not much Things have been getting on top of me.: No, I have been coping as well as ever I have been so unhappy that I have had difficulty sleeping.: Not at all I have felt sad or miserable.: No, not at all I have been so unhappy that I have been crying.: No, never The thought of harming myself has occurred to me.: Never Edinburgh Postnatal Depression Scale Total: 2  PHQ2-9 Depression Scale:     Discharge Follow-up: Edinburgh score requires follow up?: No Patient was advised of the following resources:: Breastfeeding Support Group, Support Group  Post-discharge interventions: Reviewed Newborn Safe Sleep Practices  Signature  Rosaline Deretha PEAK

## 2025-01-01 ENCOUNTER — Ambulatory Visit: Admitting: Advanced Practice Midwife

## 2025-01-08 ENCOUNTER — Ambulatory Visit: Admitting: Obstetrics and Gynecology

## 2025-01-30 ENCOUNTER — Ambulatory Visit: Admitting: Obstetrics & Gynecology
